# Patient Record
Sex: Female | Born: 1990 | Race: Black or African American | Hispanic: No | Marital: Single | Smoking: Never smoker
Health system: Southern US, Community
[De-identification: ages and names within clinical notes are randomized; demographics above are authoritative.]

---
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
## 2013-01-19 ENCOUNTER — Ambulatory Visit (INDEPENDENT_AMBULATORY_CARE_PROVIDER_SITE_OTHER): Payer: BC Managed Care – PPO | Admitting: Emergency Medicine

## 2013-01-19 VITALS — BP 108/62 | HR 73 | Temp 98.1°F | Resp 18 | Ht 62.0 in | Wt 174.0 lb

## 2013-01-19 DIAGNOSIS — Z111 Encounter for screening for respiratory tuberculosis: Secondary | ICD-10-CM

## 2013-01-19 DIAGNOSIS — Z0000 Encounter for general adult medical examination without abnormal findings: Secondary | ICD-10-CM

## 2013-01-19 NOTE — Progress Notes (Signed)
[facility] [address], [place] [address] [phone]  Date:  01/19/2013   Name:  Daisy Flores   DOB:  10-Jun-1991   MRN:  [id number]  PCP:  No primary provider on file.    Chief Complaint: Annual Exam   History of Present Illness:  Daisy Flores is a 22 y.o. very pleasant female patient who presents with the following:  Wellness examination.  No medications. Denies other complaint or health concern today.    There are no active problems to display for this patient.   History reviewed. No pertinent past medical history.  History reviewed. No pertinent past surgical history.  History  Substance Use Topics  . Smoking status: Never Smoker   . Smokeless tobacco: Not on file  . Alcohol Use: No    Family History  Problem Relation Age of Onset  . Hypertension Mother   . Hypertension Father     No Known Allergies  Medication list has been reviewed and updated.  No current outpatient prescriptions on file prior to visit.   No current facility-administered medications on file prior to visit.    Review of Systems:  As per HPI, otherwise negative.    Physical Examination: Filed Vitals:   01/19/13 1331  BP: 108/62  Pulse: 73  Temp: 98.1 F (36.7 C)  Resp: 18   Filed Vitals:   01/19/13 1331  Height: 5\' 2"  (1.575 m)  Weight: 174 lb (78.926 kg)   Body mass index is 31.82 kg/(m^2). Ideal Body Weight: Weight in (lb) to have BMI = 25: 136.4  GEN: WDWN, NAD, Non-toxic, A & O x 3 HEENT: Atraumatic, Normocephalic. Neck supple. No masses, No LAD. Ears and Nose: No external deformity. CV: RRR, No M/G/R. No JVD. No thrill. No extra heart sounds. PULM: CTA B, no wheezes, crackles, rhonchi. No retractions. No resp. distress. No accessory muscle use. ABD: S, NT, ND, +BS. No rebound. No HSM. EXTR: No c/c/e NEURO Normal gait.  PSYCH: Normally interactive. Conversant. Not depressed or anxious appearing.  Calm demeanor.    Assessment and  Plan: Wellness examination TB screening completed. Recent labs   Signed,  Phillips Odor, MD

## 2013-01-19 NOTE — Addendum Note (Signed)
Addended by: Sheppard Plumber A on: 01/19/2013 02:29 PM   Modules accepted: Orders

## 2013-01-19 NOTE — Progress Notes (Signed)
  Tuberculosis Risk Questionnaire  1. Were you born outside the [place] in one of the following parts of the world: [place], [place], [place], [place] America or Eastern [place]?  No  2. Have you traveled outside the [place] and lived for more than one month in one of the following parts of the world: [place], [place], [place], [place] or [place]?  No  3. Do you have a compromised immune system such as from any of the following conditions:HIV/AIDS, organ or bone marrow transplantation, diabetes, immunosuppressive medicines (e.g. Prednisone, Remicaide), leukemia, lymphoma, cancer of the head or neck, gastrectomy or jejunal bypass, end-stage renal disease (on dialysis), or silicosis?  No   4. Have you ever or do you plan on working in: a residential care center, a health care facility, a jail or prison or homeless shelter?  No  5. Have you ever: injected illegal drugs, used crack cocaine, lived in a homeless shelter  or been in jail or prison?   No  6. Have you ever been exposed to anyone with infectious tuberculosis?  No   Tuberculosis Symptom Questionnaire  Do you currently have any of the following symptoms?  1. Unexplained cough lasting more than 3 weeks? No  2. Unexplained fever lasting more than 3 weeks. No   3. Night Sweats (sweating that leaves the bedclothes and sheets wet)   No  4. Shortness of Breath No  5. Chest Pain No  6. Unintentional weight loss  No  7. Unexplained fatigue (very tired for no reason) No  

## 2013-01-21 ENCOUNTER — Ambulatory Visit (INDEPENDENT_AMBULATORY_CARE_PROVIDER_SITE_OTHER): Payer: BC Managed Care – PPO | Admitting: Radiology

## 2013-01-21 DIAGNOSIS — Z111 Encounter for screening for respiratory tuberculosis: Secondary | ICD-10-CM

## 2013-01-21 LAB — TB SKIN TEST
Induration: 0 mm
TB Skin Test: NEGATIVE

## 2014-08-11 ENCOUNTER — Encounter (HOSPITAL_BASED_OUTPATIENT_CLINIC_OR_DEPARTMENT_OTHER): Payer: Self-pay | Admitting: *Deleted

## 2014-08-11 ENCOUNTER — Emergency Department (HOSPITAL_BASED_OUTPATIENT_CLINIC_OR_DEPARTMENT_OTHER): Payer: BC Managed Care – PPO

## 2014-08-11 ENCOUNTER — Emergency Department (HOSPITAL_BASED_OUTPATIENT_CLINIC_OR_DEPARTMENT_OTHER)
Admission: EM | Admit: 2014-08-11 | Discharge: 2014-08-11 | Disposition: A | Discharge status: Home / Self Care | Payer: BC Managed Care – PPO | Attending: Emergency Medicine | Admitting: Emergency Medicine

## 2014-08-11 ENCOUNTER — Telehealth: Payer: Self-pay | Admitting: Obstetrics

## 2014-08-11 DIAGNOSIS — R9431 Abnormal electrocardiogram [ECG] [EKG]: Secondary | ICD-10-CM | POA: Diagnosis not present

## 2014-08-11 DIAGNOSIS — Z3202 Encounter for pregnancy test, result negative: Secondary | ICD-10-CM | POA: Insufficient documentation

## 2014-08-11 DIAGNOSIS — F419 Anxiety disorder, unspecified: Secondary | ICD-10-CM | POA: Diagnosis not present

## 2014-08-11 DIAGNOSIS — R002 Palpitations: Secondary | ICD-10-CM | POA: Diagnosis present

## 2014-08-11 DIAGNOSIS — R0602 Shortness of breath: Secondary | ICD-10-CM | POA: Insufficient documentation

## 2014-08-11 DIAGNOSIS — R000 Tachycardia, unspecified: Secondary | ICD-10-CM | POA: Diagnosis not present

## 2014-08-11 LAB — URINALYSIS, ROUTINE W REFLEX MICROSCOPIC
Bilirubin Urine: NEGATIVE
GLUCOSE, UA: NEGATIVE mg/dL
HGB URINE DIPSTICK: NEGATIVE
Ketones, ur: NEGATIVE mg/dL
Leukocytes, UA: NEGATIVE
Nitrite: NEGATIVE
PH: 6.5 (ref 5.0–8.0)
Protein, ur: NEGATIVE mg/dL
SPECIFIC GRAVITY, URINE: 1.004 — AB (ref 1.005–1.030)
UROBILINOGEN UA: 0.2 mg/dL (ref 0.0–1.0)

## 2014-08-11 LAB — BASIC METABOLIC PANEL
Anion gap: 6 (ref 5–15)
BUN: 11 mg/dL (ref 6–23)
CALCIUM: 9.1 mg/dL (ref 8.4–10.5)
CHLORIDE: 105 mmol/L (ref 96–112)
CO2: 24 mmol/L (ref 19–32)
Creatinine, Ser: 0.96 mg/dL (ref 0.50–1.10)
GFR calc Af Amer: >90 mL/min (ref >90)
GFR calc non Af Amer: 83 mL/min — ABNORMAL LOW (ref >90)
GLUCOSE: 145 mg/dL — AB (ref 70–99)
Potassium: 3.4 mmol/L — ABNORMAL LOW (ref 3.5–5.1)
SODIUM: 135 mmol/L (ref 135–145)

## 2014-08-11 LAB — D-DIMER, QUANTITATIVE (NOT AT ARMC)

## 2014-08-11 LAB — CBC
HCT: 37.7 % (ref 36.0–46.0)
Hemoglobin: 13.3 g/dL (ref 12.0–15.0)
MCH: 31.3 pg (ref 26.0–34.0)
MCHC: 35.3 g/dL (ref 30.0–36.0)
MCV: 88.7 fL (ref 78.0–100.0)
PLATELETS: 370 10*3/uL (ref 150–400)
RBC: 4.25 MIL/uL (ref 3.87–5.11)
RDW: 12.7 % (ref 11.5–15.5)
WBC: 5.3 10*3/uL (ref 4.0–10.5)

## 2014-08-11 LAB — PREGNANCY, URINE: Preg Test, Ur: NEGATIVE

## 2014-08-11 LAB — TROPONIN I: Troponin I: <0.03 ng/mL (ref <0.031)

## 2014-08-11 MED ORDER — LORAZEPAM 1 MG PO TABS: 1.0000 mg | ORAL_TABLET | Freq: Three times a day (TID) | ORAL | Status: AC | PRN

## 2014-08-11 MED ORDER — LORAZEPAM 2 MG/ML IJ SOLN
1.0000 mg | Freq: Once | INTRAMUSCULAR | Status: AC
  Administered 2014-08-11: 1 mg via INTRAVENOUS
  Filled 2014-08-11: qty 1

## 2014-08-11 NOTE — ED Provider Notes (Signed)
CSN: [id number]     Arrival date & time 08/11/14  1520 History   First MD Initiated Contact with Patient 08/11/14 1529     Chief Complaint  Patient presents with  . Palpitations     (Consider location/radiation/quality/duration/timing/severity/associated sxs/prior Treatment) HPI Comments: 24 year old female complaining of palpitations and shortness of breath 3 days. Palpitations have increased in intensity, shortness of breath worse when she is talking for long period of time. 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 She was seen by her PCP at the urgent care center 2 days ago and was started on anti-anxiety medication which is not helping her symptoms and believes is making them worse. Admits to being very nervous at this time as she is not sure why she has these symptoms. Denies chest pain, lightheadedness, dizziness. No family history of early heart problems. No history of blood clots. No exogenous estrogen, no recent surgeries, nonsmoker, no recent long travel. Denies calf pain or swelling.  Patient is a 24 y.o. female presenting with palpitations. The history is provided by the patient, a relative and a parent.  Palpitations Palpitations quality:  Fast Onset quality:  Gradual Timing:  Constant Progression:  Worsening Chronicity:  New Relieved by:  Nothing Worsened by:  Stress (talking a lot) Ineffective treatments: anxiolytic. Associated symptoms: shortness of breath   Risk factors: no hx of DVT and no hx of PE     History reviewed. No pertinent past medical history. History reviewed. No pertinent past surgical history. Family History  Problem Relation Age of Onset  . Hypertension Mother   . Hypertension Father    History  Substance Use Topics  . Smoking status: Never Smoker   . Smokeless tobacco: Not on file  . Alcohol Use: No   OB History    No data available     Review of Systems  Respiratory: Positive for shortness of breath.    Cardiovascular: Positive for palpitations.  Psychiatric/Behavioral: The patient is nervous/anxious.   All other systems reviewed and are negative.     Allergies  Review of patient's allergies indicates no known allergies.  Home Medications   Prior to Admission medications   Medication Sig Start Date End Date Taking? Authorizing Provider  LORazepam (ATIVAN) 1 MG tablet Take 1 tablet (1 mg total) by mouth every 8 (eight) hours as needed for anxiety. 08/11/14   Tyneka Scafidi M Hermon Zea, PA-C   BP 129/81 mmHg  Pulse 116  Temp(Src) 98.3 F (36.8 C)  Resp 16  Ht 5\' 3"  (1.6 m)  Wt 164 lb (74.39 kg)  BMI 29.06 kg/m2  SpO2 100%  LMP 08/05/2014 Physical Exam  Constitutional: She is oriented to person, place, and time. She appears well-developed and well-nourished. No distress.  HENT:  Head: Normocephalic and atraumatic.  Mouth/Throat: Oropharynx is clear and moist.  Eyes: Conjunctivae and EOM are normal. Pupils are equal, round, and reactive to light.  Neck: Normal range of motion. Neck supple. No JVD present.  Cardiovascular: Regular rhythm, normal heart sounds and intact distal pulses.   Tachycardic. No extremity edema.  Pulmonary/Chest: Effort normal and breath sounds normal. No respiratory distress.  Abdominal: Soft. Bowel sounds are normal. There is no tenderness.  Musculoskeletal: Normal range of motion. She exhibits no edema.  Neurological: She is alert and oriented to person, place, and time. She has normal strength. No sensory deficit.  Speech fluent, goal oriented. Moves limbs without ataxia. Equal grip strength bilateral.  Skin: Skin is warm  and dry. She is not diaphoretic.  Psychiatric: Her behavior is normal. Her mood appears anxious.  Nursing note and vitals reviewed.   ED Course  Procedures (including critical care time) Labs Review Labs Reviewed  BASIC METABOLIC PANEL - Abnormal; Notable for the following:    Potassium 3.4 (*)    Glucose, Bld 145 (*)    GFR calc non  Af Amer 83 (*)    All other components within normal limits  URINALYSIS, ROUTINE W REFLEX MICROSCOPIC - Abnormal; Notable for the following:    Specific Gravity, Urine 1.004 (*)    All other components within normal limits  CBC  D-DIMER, QUANTITATIVE  TROPONIN I  PREGNANCY, URINE    Imaging Review Dg Chest 2 View  08/11/2014   CLINICAL DATA:  Palpitations  EXAM: CHEST  2 VIEW  COMPARISON:  None.  FINDINGS: Cardiomediastinal silhouette is unremarkable. No acute infiltrate or pleural effusion. No pulmonary edema. Bony thorax is unremarkable.  IMPRESSION: No active cardiopulmonary disease.   Electronically Signed   By: Natasha Mead M.D.   On: 08/11/2014 16:35     EKG Interpretation   Date/Time:  Wednesday August 11 2014 15:30:59 EST Ventricular Rate:  112 PR Interval:  132 QRS Duration: 80 QT Interval:  366 QTC Calculation: 499 R Axis:   95 Text Interpretation:  Sinus tachycardia Possible Left atrial enlargement  Rightward axis T wave abnormality, consider inferior ischemia T wave  abnormality, consider anterolateral ischemia Abnormal ECG No old tracing  to compare Confirmed by GOLDSTON  MD, SCOTT ([id number]) on 08/11/2014 3:30:58 PM      MDM   Final diagnoses:  Palpitations  Abnormal EKG  Anxiety   Anxious but in NAD. Afebrile, tachycardic, vitals otherwise stable. Initial concern for PE given tachycardia with associated palpitations and shortness of breath. D-dimer within normal limits. No risk factors for PE. Nonspecific findings on EKG, no old to compare. Dr. Criss Alvine discussed this EKG while he was on the phone with the cardiologist Dr. Anne Fu, who states the findings are nonspecific, however will have follow-up outpatient in the office. He made an appointment for her for March 15 at 2 PM. Workup otherwise negative. Reports improvement with Ativan. Advised her to no longer take clorazepate and to start taking Ativan prn. Stable for discharge. Return precautions given. Patient states  understanding of treatment care plan and is agreeable.  Discussed with attending Dr. Criss Alvine who agrees with plan of care.    Kathrynn Speed, PA-C 08/11/14 1726  Pricilla Loveless, MD 08/16/14 [phone]

## 2014-08-11 NOTE — ED Notes (Signed)
Pt reports she has felt like her heart has been racing and she has had some shortness of breath. She states that she has been to urgent care and given meds for anxiety without relief

## 2014-08-11 NOTE — Discharge Instructions (Signed)
Take Ativanas needed as directed for anxiety. No driving or operating heavy machinery while taking ativan. This medication may cause drowsiness.  Palpitations A palpitation is the feeling that your heartbeat is irregular or is faster than normal. It may feel like your heart is fluttering or skipping a beat. Palpitations are usually not a serious problem. However, in some cases, you may need further medical evaluation. CAUSES  Palpitations can be caused by:  Smoking.  Caffeine or other stimulants, such as diet pills or energy drinks.  Alcohol.  Stress and anxiety.  Strenuous physical activity.  Fatigue.  Certain medicines.  Heart disease, especially if you have a history of irregular heart rhythms (arrhythmias), such as atrial fibrillation, atrial flutter, or supraventricular tachycardia.  An improperly working pacemaker or defibrillator. DIAGNOSIS  To find the cause of your palpitations, your health care provider will take your medical history and perform a physical exam. Your health care provider may also have you take a test called an ambulatory electrocardiogram (ECG). An ECG records your heartbeat patterns over a 24-hour period. You may also have other tests, such as:  Transthoracic echocardiogram (TTE). During echocardiography, sound waves are used to evaluate how blood flows through your heart.  Transesophageal echocardiogram (TEE).  Cardiac monitoring. This allows your health care provider to monitor your heart rate and rhythm in real time.  Holter monitor. This is a portable device that records your heartbeat and can help diagnose heart arrhythmias. It allows your health care provider to track your heart activity for several days, if needed.  Stress tests by exercise or by giving medicine that makes the heart beat faster. TREATMENT  Treatment of palpitations depends on the cause of your symptoms and can vary greatly. Most cases of palpitations do not require any treatment  other than time, relaxation, and monitoring your symptoms. Other causes, such as atrial fibrillation, atrial flutter, or supraventricular tachycardia, usually require further treatment. HOME CARE INSTRUCTIONS   Avoid:  Caffeinated coffee, tea, soft drinks, diet pills, and energy drinks.  Chocolate.  Alcohol.  Stop smoking if you smoke.  Reduce your stress and anxiety. Things that can help you relax include:  A method of controlling things in your body, such as your heartbeats, with your mind (biofeedback).  Yoga.  Meditation.  Physical activity such as swimming, jogging, or walking.  Get plenty of rest and sleep. SEEK MEDICAL CARE IF:   You continue to have a fast or irregular heartbeat beyond 24 hours.  Your palpitations occur more often. SEEK IMMEDIATE MEDICAL CARE IF:  You have chest pain or shortness of breath.  You have a severe headache.  You feel dizzy or you faint. MAKE SURE YOU:  Understand these instructions.  Will watch your condition.  Will get help right away if you are not doing well or get worse. Document Released: 05/25/2000 Document Revised: 06/02/2013 Document Reviewed: 07/27/2011 [facility] Patient Information 2015 [place]. This information is not intended to replace advice given to you by your health care provider. Make sure you discuss any questions you have with your health care provider.  Panic Attacks Panic attacks are sudden, short-livedsurges of severe anxiety, fear, or discomfort. They may occur for no reason when you are relaxed, when you are anxious, or when you are sleeping. Panic attacks may occur for a number of reasons:   Healthy people occasionally have panic attacks in extreme, life-threatening situations, such as war or natural disasters. Normal anxiety is a protective mechanism of the body that helps us  react to danger (fight or flight response).  Panic attacks are often seen with anxiety disorders, such as panic disorder,  social anxiety disorder, generalized anxiety disorder, and phobias. Anxiety disorders cause excessive or uncontrollable anxiety. They may interfere with your relationships or other life activities.  Panic attacks are sometimes seen with other mental illnesses, such as depression and posttraumatic stress disorder.  Certain medical conditions, prescription medicines, and drugs of abuse can cause panic attacks. SYMPTOMS  Panic attacks start suddenly, peak within 20 minutes, and are accompanied by four or more of the following symptoms:  Pounding heart or fast heart rate (palpitations).  Sweating.  Trembling or shaking.  Shortness of breath or feeling smothered.  Feeling choked.  Chest pain or discomfort.  Nausea or strange feeling in your stomach.  Dizziness, light-headedness, or feeling like you will faint.  Chills or hot flushes.  Numbness or tingling in your lips or hands and feet.  Feeling that things are not real or feeling that you are not yourself.  Fear of losing control or going crazy.  Fear of dying. Some of these symptoms can mimic serious medical conditions. For example, you may think you are having a heart attack. Although panic attacks can be very scary, they are not life threatening. DIAGNOSIS  Panic attacks are diagnosed through an assessment by your health care provider. Your health care provider will ask questions about your symptoms, such as where and when they occurred. Your health care provider will also ask about your medical history and use of alcohol and drugs, including prescription medicines. Your health care provider may order blood tests or other studies to rule out a serious medical condition. Your health care provider may refer you to a mental health professional for further evaluation. TREATMENT   Most healthy people who have one or two panic attacks in an extreme, life-threatening situation will not require treatment.  The treatment for panic  attacks associated with anxiety disorders or other mental illness typically involves counseling with a mental health professional, medicine, or a combination of both. Your health care provider will help determine what treatment is best for you.  Panic attacks due to physical illness usually go away with treatment of the illness. If prescription medicine is causing panic attacks, talk with your health care provider about stopping the medicine, decreasing the dose, or substituting another medicine.  Panic attacks due to alcohol or drug abuse go away with abstinence. Some adults need professional help in order to stop drinking or using drugs. HOME CARE INSTRUCTIONS   Take all medicines as directed by your health care provider.   Schedule and attend follow-up visits as directed by your health care provider. It is important to keep all your appointments. SEEK MEDICAL CARE IF:  You are not able to take your medicines as prescribed.  Your symptoms do not improve or get worse. SEEK IMMEDIATE MEDICAL CARE IF:   You experience panic attack symptoms that are different than your usual symptoms.  You have serious thoughts about hurting yourself or others.  You are taking medicine for panic attacks and have a serious side effect. MAKE SURE YOU:  Understand these instructions.  Will watch your condition.  Will get help right away if you are not doing well or get worse. Document Released: 05/28/2005 Document Revised: 06/02/2013 Document Reviewed: 01/09/2013 [facility] Patient Information 2015 [place], [place]. This information is not intended to replace advice given to you by your health care provider. Make sure you discuss any questions you  have with your health care provider. ° °

## 2014-08-13 NOTE — Telephone Encounter (Signed)
08/13/2014 - Patient called and scheduled AEX for 08/26/2014 @ 3:45p. brm

## 2014-08-24 ENCOUNTER — Ambulatory Visit (INDEPENDENT_AMBULATORY_CARE_PROVIDER_SITE_OTHER): Payer: BC Managed Care – PPO | Admitting: Cardiology

## 2014-08-24 ENCOUNTER — Encounter: Payer: Self-pay | Admitting: Cardiology

## 2014-08-24 VITALS — BP 132/79 | HR 70 | Ht 63.0 in | Wt 160.0 lb

## 2014-08-24 DIAGNOSIS — R002 Palpitations: Secondary | ICD-10-CM

## 2014-08-24 DIAGNOSIS — R9431 Abnormal electrocardiogram [ECG] [EKG]: Secondary | ICD-10-CM

## 2014-08-24 DIAGNOSIS — R0789 Other chest pain: Secondary | ICD-10-CM

## 2014-08-24 DIAGNOSIS — R000 Tachycardia, unspecified: Secondary | ICD-10-CM

## 2014-08-24 NOTE — Patient Instructions (Signed)
The current medical regimen is effective;  continue present plan and medications.  Your physician has requested that you have an echocardiogram. Echocardiography is a painless test that uses sound waves to create images of your heart. It provides your doctor with information about the size and shape of your heart and how well your heart's chambers and valves are working. This procedure takes approximately one hour. There are no restrictions for this procedure.  Please have a TSH drawn when you see your OB/GYN.  Follow up as needed.  Thank you for choosing [facility]!!

## 2014-08-24 NOTE — Progress Notes (Signed)
Cardiology Office Note   Date:  08/24/2014   ID:  Daisy Flores, DOB [birth year], MRN [id number]  PCP:  No PCP Per Patient  Cardiologist:   Donato Schultz, MD   Chief Complaint  Patient presents with  . Advice Only      History of Present Illness: Daisy Flores is a 24 y.o. female who presents for evaluation of palpitations. She was seen in the emergency department on 08/11/14 after complaining of shortness of breath for 3 days. She was quite anxious at urgent care. Felt shortness of breath. D-dimer was performed in the emergency room and was normal. EKG on 08/11/14 showed sinus tachycardia rate 112 with T-wave inversions, fairly nonspecific diffusely. Chest x-ray was normal, personally viewed. Blood work showed slightly decreased potassium at 3.4. Otherwise unremarkable. Glucose was also slightly elevated at 145.  She was feeling some anterior wall chest discomfort. Her father stated that she was carrying 3 bags after recent trip, straining. No exertional chest discomfort. She wonders if after she started to feel this discomfort is anxiety took over and she began to feel short of breath. She is no longer having any of her symptoms.  She is a [employer] officer.  No past medical history on file.  No past surgical history on file.   Current Outpatient Prescriptions  Medication Sig Dispense Refill  . amoxicillin (AMOXIL) 500 MG capsule   0  . clarithromycin (BIAXIN) 500 MG tablet Take 500 mg by mouth 2 (two) times daily.  0  . cyclobenzaprine (FLEXERIL) 5 MG tablet Take 5 mg by mouth 3 (three) times daily as needed.  0  . naproxen (NAPROSYN) 500 MG tablet Take 500 mg by mouth every 12 (twelve) hours.  0  . LORazepam (ATIVAN) 1 MG tablet Take 1 tablet (1 mg total) by mouth every 8 (eight) hours as needed for anxiety. (Patient not taking: Reported on 08/24/2014) 10 tablet 0   No current facility-administered medications for this visit.    Allergies:   Penicillins    Social History:   The patient  reports that she has never smoked. She does not have any smokeless tobacco history on file. She reports that she does not drink alcohol or use illicit drugs.   Family History:  The patient's family history includes Hypertension in her father and mother.    ROS:  Please see the history of present illness.   Otherwise, review of systems are positive for none.   All other systems are reviewed and negative.    PHYSICAL EXAM: VS:  BP 132/79 mmHg  Pulse 70  Ht  (1.6 m)  Wt 160 lb (72.576 kg)  BMI 28.35 kg/m2  LMP 08/05/2014 , BMI Body mass index is 28.35 kg/(m^2). GEN: Well nourished, well developed, in no acute distress HEENT: normal Neck: no JVD, carotid bruits, or masses, mild fullness in thyroid region Cardiac: RRR (at first, mildly tachycardic but quickly resumed normal rate; no murmurs, rubs, or gallops,no edema  Respiratory:  clear to auscultation bilaterally, normal work of breathing GI: soft, nontender, nondistended, + BS MS: no deformity or atrophy Skin: warm and dry, no rash Neuro:  Strength and sensation are intact Psych: euthymic mood, full affect   EKG:  08/11/14-sinus tachycardia 116 with T-wave inversions diffusely, fairly nonspecific changes. Perhaps repolarization abnormality because of tachycardia.   Recent Labs: 08/11/2014: BUN 11; Creatinine 0.96; Hemoglobin 13.3; Platelets 370; Potassium 3.4*; Sodium 135    Lipid Panel No results found for: CHOL, TRIG, HDL,  CHOLHDL, VLDL, LDLCALC, LDLDIRECT    Wt Readings from Last 3 Encounters:  08/24/14 160 lb (72.576 kg)  08/11/14 164 lb (74.39 kg)  01/19/13 174 lb (78.926 kg)      Other studies Reviewed: Additional studies/ records that were reviewed today include: Review of emergency room labs, chest x-ray personally reviewed and normal.    ASSESSMENT AND PLAN:  1.  Abnormal EKG-fairly nonspecific T-wave changes during tachycardia/anxiety in the emergency room. I would like to check an  echocardiogram to ensure proper structure and function and to exclude hypertrophic phenotype.  2. Atypical chest pain-likely secondary to anxiety. Perhaps musculoskeletal because she was carrying several bags of luggage after a recent trip. No further recurrence. She is feeling well. Reassurance.  3. Tachycardia-she is going to see the OB/GYN soon. I asked her to have her TSH checked. There is mild prominence in the thyroid region. I want to exclude the possibility of hyperthyroidism causing tachycardia/anxiety. This seems unlikely since she is now feeling normal.  4. Elevated glucose-145 nonfasting. Asked her to follow-up with this with her primary OB/GYN.  5. Mildly decreased potassium-3.4. She does not like bananas or orange juice. Try other foods higher in potassium.  We will follow-up with echocardiogram and see her on as-needed basis.   Current medicines are reviewed at length with the patient today.  The patient does not have concerns regarding medicines.  The following changes have been made:  no change  Labs/ tests ordered today include:   Orders Placed This Encounter  Procedures  . 2D Echocardiogram without contrast     Disposition:   FU with echocardiogram. Otherwise when necessary basis.   Mathews RobinsonsSigned, Trianna Lupien, MD  08/24/2014 2:44 PM    [facility] [address], [place], [place]  [address] Phone: [phone]; Fax: [phone]

## 2014-08-26 ENCOUNTER — Encounter: Payer: Self-pay | Admitting: Certified Nurse Midwife

## 2014-08-26 ENCOUNTER — Ambulatory Visit (INDEPENDENT_AMBULATORY_CARE_PROVIDER_SITE_OTHER): Payer: BC Managed Care – PPO | Admitting: Certified Nurse Midwife

## 2014-08-26 VITALS — BP 130/86 | HR 96 | Temp 99.1°F | Ht 63.0 in | Wt 155.0 lb

## 2014-08-26 DIAGNOSIS — Z124 Encounter for screening for malignant neoplasm of cervix: Secondary | ICD-10-CM

## 2014-08-26 DIAGNOSIS — N63 Unspecified lump in unspecified breast: Secondary | ICD-10-CM

## 2014-08-26 DIAGNOSIS — R739 Hyperglycemia, unspecified: Secondary | ICD-10-CM

## 2014-08-26 DIAGNOSIS — N946 Dysmenorrhea, unspecified: Secondary | ICD-10-CM

## 2014-08-26 DIAGNOSIS — A749 Chlamydial infection, unspecified: Secondary | ICD-10-CM

## 2014-08-26 DIAGNOSIS — F410 Panic disorder [episodic paroxysmal anxiety] without agoraphobia: Secondary | ICD-10-CM | POA: Diagnosis not present

## 2014-08-26 DIAGNOSIS — R7309 Other abnormal glucose: Secondary | ICD-10-CM

## 2014-08-26 DIAGNOSIS — Z01419 Encounter for gynecological examination (general) (routine) without abnormal findings: Secondary | ICD-10-CM | POA: Diagnosis not present

## 2014-08-26 DIAGNOSIS — E569 Vitamin deficiency, unspecified: Secondary | ICD-10-CM | POA: Diagnosis not present

## 2014-08-26 DIAGNOSIS — R000 Tachycardia, unspecified: Secondary | ICD-10-CM

## 2014-08-26 DIAGNOSIS — Z113 Encounter for screening for infections with a predominantly sexual mode of transmission: Secondary | ICD-10-CM

## 2014-08-26 LAB — THYROID PANEL WITH TSH
Free Thyroxine Index: 2.4 (ref 1.4–3.8)
T3 UPTAKE: 28 % (ref 22–35)
T4, Total: 8.6 ug/dL (ref 4.5–12.0)
TSH: 1.482 u[IU]/mL (ref 0.350–4.500)

## 2014-08-26 LAB — COMPREHENSIVE METABOLIC PANEL
ALBUMIN: 4.4 g/dL (ref 3.5–5.2)
ALT: <8 U/L (ref 0–35)
AST: 16 U/L (ref 0–37)
Alkaline Phosphatase: 52 U/L (ref 39–117)
BUN: 17 mg/dL (ref 6–23)
CALCIUM: 9.8 mg/dL (ref 8.4–10.5)
CHLORIDE: 103 meq/L (ref 96–112)
CO2: 21 mEq/L (ref 19–32)
Creat: 1.07 mg/dL (ref 0.50–1.10)
Glucose, Bld: 88 mg/dL (ref 70–99)
POTASSIUM: 4 meq/L (ref 3.5–5.3)
SODIUM: 138 meq/L (ref 135–145)
Total Bilirubin: 0.5 mg/dL (ref 0.2–1.2)
Total Protein: 7.2 g/dL (ref 6.0–8.3)

## 2014-08-26 LAB — HEPATITIS B SURFACE ANTIGEN: Hepatitis B Surface Ag: NEGATIVE

## 2014-08-26 LAB — HEPATITIS C ANTIBODY: HCV Ab: NEGATIVE

## 2014-08-26 MED ORDER — NORGESTIM-ETH ESTRAD TRIPHASIC 0.18/0.215/0.25 MG-25 MCG PO TABS: 1.0000 | ORAL_TABLET | Freq: Every day | ORAL | Status: DC

## 2014-08-26 MED ORDER — AZITHROMYCIN 200 MG/5ML PO SUSR: ORAL | Status: AC

## 2014-08-26 MED ORDER — IBUPROFEN 50 MG PO CHEW: CHEWABLE_TABLET | ORAL | Status: DC

## 2014-08-26 NOTE — Progress Notes (Signed)
Patient ID: Daisy Flores, female   DOB: December 13, 1990, 24 y.o.   MRN: [id number]   Subjective:     Daisy Flores is a 24 y.o. female here for a routine exam.  Current complaints: recent STI exposure and recent dx of tachycardia desiring f/u, along with desire to get a contraceptive that will help with her acne.  C/O menstrual cramping with increased flow the first few days.  Denies any clots with blood flow.  Denies any hx for family hx of cancers, MI, HTN.  Has an echocardiogram scheduled for May 2016.  Denies any new sexual partner.  Encouraged safe sex practices.  Educated on SBE exams.  Cannot tolerate taking large medications, requested liquid/chewables.      Personal health questionnaire:  Is patient Ashkenazi Jewish, have a family history of breast and/or ovarian cancer: no Is there a family history of uterine cancer diagnosed at age < 51, gastrointestinal cancer, urinary tract cancer, family member who is a Personnel officer syndrome-associated carrier: no Is the patient overweight and hypertensive, family history of diabetes, personal history of gestational diabetes, preeclampsia or PCOS: no Is patient over 28, have PCOS,  family history of premature CHD under age 32, diabetes, smoke, have hypertension or peripheral artery disease:  no At any time, has a partner hit, kicked or otherwise hurt or frightened you?: no Over the past 2 weeks, have you felt down, depressed or hopeless?: no Over the past 2 weeks, have you felt little interest or pleasure in doing things?:no   Gynecologic History Patient's last menstrual period was 08/05/2014. Contraception: none Last Pap: none.  Last mammogram: none.   Obstetric History OB History  No data available    History reviewed. No pertinent past medical history.  History reviewed. No pertinent past surgical history.   Current outpatient prescriptions:  .  cyclobenzaprine (FLEXERIL) 5 MG tablet, Take 5 mg by mouth 3 (three) times daily as needed., Disp: ,  Rfl: 0 .  LORazepam (ATIVAN) 1 MG tablet, Take 1 tablet (1 mg total) by mouth every 8 (eight) hours as needed for anxiety., Disp: 10 tablet, Rfl: 0 .  naproxen (NAPROSYN) 500 MG tablet, Take 500 mg by mouth every 12 (twelve) hours., Disp: , Rfl: 0 .  azithromycin (ZITHROMAX) 200 MG/5ML suspension, Take 25ml PO all at one time., Disp: 30 mL, Rfl: 0 .  clarithromycin (BIAXIN) 500 MG tablet, Take 500 mg by mouth 2 (two) times daily., Disp: , Rfl: 0 .  ibuprofen (ADVIL,MOTRIN) 50 MG chewable tablet, Up to 800 mg three times a day for cramps., Disp: 60 tablet, Rfl: 2 .  Norgestimate-Ethinyl Estradiol Triphasic (ORTHO TRI-CYCLEN LO) 0.18/0.215/0.25 MG-25 MCG tab, Take 1 tablet by mouth daily., Disp: 1 Package, Rfl: 11 Allergies  Allergen Reactions  . Penicillins Hives and Rash    PT STATES ALL TYPES OF CILLINS CAUSE RASHES AS WELL AS HIVES    History  Substance Use Topics  . Smoking status: Never Smoker   . Smokeless tobacco: Not on file  . Alcohol Use: Yes     Comment: Occ.     Family History  Problem Relation Age of Onset  . Hypertension Mother   . Hypertension Father       Review of Systems  Constitutional: negative for fatigue and weight loss Respiratory: negative for cough and wheezing Cardiovascular: negative for chest pain, fatigue and palpitations Gastrointestinal: negative for abdominal pain and change in bowel habits Musculoskeletal:negative for myalgias Neurological: negative for gait problems and tremors Behavioral/Psych: negative for  abusive relationship, depression Endocrine: negative for temperature intolerance   Genitourinary:negative for abnormal menstrual periods, genital lesions, hot flashes, sexual problems and vaginal discharge Integument/breast: negative for breast lump, breast tenderness, nipple discharge and skin lesion(s)    Objective:       BP 130/86 mmHg  Pulse 96  Temp(Src) 99.1 F (37.3 C)  Ht 5\' 3"  (1.6 m)  Wt 70.308 kg (155 lb)  BMI 27.46  kg/m2  LMP 08/05/2014 General:   alert  Skin:   no rash or abnormalities  Lungs:   clear to auscultation bilaterally  Heart:   regular rate and rhythm, S1, S2 normal, no murmur, click, rub or gallop  Breasts:   normal skin no nipple changes or axillary nodes.  Bilateral suspicious masses, small noted on exam.  No nipple discharge.   Abdomen:  normal findings: no organomegaly, soft, non-tender and no hernia  Pelvis:  External genitalia: normal general appearance Urinary system: urethral meatus normal and bladder without fullness, nontender Vaginal: normal without tenderness, induration or masses Cervix: cervicitis associated with Chlamydia infection. Adnexa: normal bimanual exam Uterus: anteverted and non-tender, normal size   Lab Review Urine pregnancy test Labs reviewed yes Radiologic studies reviewed no  Assessment:     Chlamydia.   Breast masses bilaterally, most likely fibrocystic breast disease Dysmenorrhea Contraceptive Counseling High risk sexual activity STD exam Tachycardia   Plan:    Education reviewed: calcium supplements, depression evaluation, low fat, low cholesterol diet, safe sex/STD prevention, self breast exams and skin cancer screening. Contraception: OCP (estrogen/progesterone). Mammogram ordered. Follow up in: 3 months.   Meds ordered this encounter  Medications  . azithromycin (ZITHROMAX) 200 MG/5ML suspension    Sig: Take 25ml PO all at one time.    Dispense:  30 mL    Refill:  0  . ibuprofen (ADVIL,MOTRIN) 50 MG chewable tablet    Sig: Up to 800 mg three times a day for cramps.    Dispense:  60 tablet    Refill:  2  . Norgestimate-Ethinyl Estradiol Triphasic (ORTHO TRI-CYCLEN LO) 0.18/0.215/0.25 MG-25 MCG tab    Sig: Take 1 tablet by mouth daily.    Dispense:  1 Package    Refill:  11   Orders Placed This Encounter  Procedures  . SureSwab, Vaginosis/Vaginitis Plus  . MM Digital Diagnostic Bilat    Standing Status: Future     Number of  Occurrences:      Standing Expiration Date: 10/26/2015    Order Specific Question:  Reason for Exam (SYMPTOM  OR DIAGNOSIS REQUIRED)    Answer:  multiple small nodules in each breast    Order Specific Question:  Is the patient pregnant?    Answer:  No    Order Specific Question:  Preferred imaging location?    Answer[facility]  . US Unlisted Procedure Breast    Standing Status: Future     Number of Occurrences:      Standing Expiration Date: 10/26/2015    Order Specific Question:  Reason for Exam (SYMPTOM  OR DIAGNOSIS REQUIRED)    Answer:  multiple small masses in each breast    Order Specific Question:  Preferred imaging location?    [facility]  . Thyroid Panel With TSH  . Comprehensive metabolic panel  . Hemoglobin A1c  . HIV antibody (with reflex)  . Hepatitis B surface antigen  . RPR  . Hepatitis C antibody  . POCT urine pregnancy   Need to obtain previous records

## 2014-08-27 ENCOUNTER — Other Ambulatory Visit: Payer: Self-pay | Admitting: Certified Nurse Midwife

## 2014-08-27 ENCOUNTER — Telehealth: Payer: Self-pay

## 2014-08-27 DIAGNOSIS — N63 Unspecified lump in unspecified breast: Secondary | ICD-10-CM

## 2014-08-27 LAB — HEMOGLOBIN A1C
Hgb A1c MFr Bld: 5.2 % (ref <5.7)
MEAN PLASMA GLUCOSE: 103 mg/dL (ref <117)

## 2014-08-27 LAB — RPR

## 2014-08-27 LAB — HIV ANTIBODY (ROUTINE TESTING W REFLEX): HIV: NONREACTIVE

## 2014-08-27 NOTE — Telephone Encounter (Signed)
Patient has appt with GI the [facility] center at 09/02/14 at 11am

## 2014-08-29 LAB — SURESWAB, VAGINOSIS/VAGINITIS PLUS
ATOPOBIUM VAGINAE: 6.7 Log (cells/mL)
C. ALBICANS, DNA: NOT DETECTED
C. GLABRATA, DNA: NOT DETECTED
C. PARAPSILOSIS, DNA: NOT DETECTED
C. TROPICALIS, DNA: NOT DETECTED
C. trachomatis RNA, TMA: DETECTED — AB
Gardnerella vaginalis: >8 Log (cells/mL)
LACTOBACILLUS SPECIES: NOT DETECTED Log (cells/mL)
MEGASPHAERA SPECIES: 7.5 Log (cells/mL)
N. GONORRHOEAE RNA, TMA: NOT DETECTED
T. vaginalis RNA, QL TMA: NOT DETECTED

## 2014-08-30 LAB — PAP IG W/ RFLX HPV ASCU

## 2014-08-31 ENCOUNTER — Other Ambulatory Visit: Payer: BC Managed Care – PPO

## 2014-09-01 ENCOUNTER — Ambulatory Visit
Admission: RE | Admit: 2014-09-01 | Discharge: 2014-09-01 | Disposition: A | Discharge status: Home / Self Care | Payer: BC Managed Care – PPO | Source: Ambulatory Visit | Attending: Certified Nurse Midwife | Admitting: Certified Nurse Midwife

## 2014-09-01 ENCOUNTER — Other Ambulatory Visit: Payer: Self-pay | Admitting: *Deleted

## 2014-09-01 DIAGNOSIS — N63 Unspecified lump in unspecified breast: Secondary | ICD-10-CM

## 2014-09-01 DIAGNOSIS — B9689 Other specified bacterial agents as the cause of diseases classified elsewhere: Secondary | ICD-10-CM

## 2014-09-01 DIAGNOSIS — N760 Acute vaginitis: Principal | ICD-10-CM

## 2014-09-01 MED ORDER — METRONIDAZOLE 0.75 % VA GEL: 1.0000 | Freq: Every day | VAGINAL | Status: DC

## 2014-09-02 ENCOUNTER — Other Ambulatory Visit: Payer: BC Managed Care – PPO

## 2014-09-07 ENCOUNTER — Encounter: Payer: BC Managed Care – PPO | Admitting: Obstetrics

## 2014-09-08 ENCOUNTER — Telehealth: Payer: Self-pay | Admitting: *Deleted

## 2014-09-08 NOTE — Telephone Encounter (Signed)
Patient state she is currently on her period and had to cancel her colposcopy appointment. Patient calling to reschedule the appointment. Patient has been rescheduled for September 21, 2014 @ 3:15 pm.

## 2014-09-13 ENCOUNTER — Encounter: Payer: BC Managed Care – PPO | Admitting: Obstetrics

## 2014-09-21 ENCOUNTER — Ambulatory Visit (INDEPENDENT_AMBULATORY_CARE_PROVIDER_SITE_OTHER): Payer: BC Managed Care – PPO | Admitting: Obstetrics

## 2014-09-21 ENCOUNTER — Encounter: Payer: Self-pay | Admitting: Obstetrics

## 2014-09-21 VITALS — BP 114/66 | HR 81 | Temp 98.4°F | Wt 161.0 lb

## 2014-09-21 DIAGNOSIS — R87613 High grade squamous intraepithelial lesion on cytologic smear of cervix (HGSIL): Secondary | ICD-10-CM | POA: Diagnosis not present

## 2014-09-21 DIAGNOSIS — Z01812 Encounter for preprocedural laboratory examination: Secondary | ICD-10-CM

## 2014-09-21 DIAGNOSIS — A749 Chlamydial infection, unspecified: Secondary | ICD-10-CM

## 2014-09-21 NOTE — Progress Notes (Signed)
Colposcopy Procedure Note  Indications: Pap smear 3 weeks ago showed: high-grade squamous intraepithelial neoplasia  (HGSIL-encompassing moderate and severe dysplasia). The is 1st pap smear. Prior cervical/vaginal disease: normal exam without visible pathology. Prior cervical treatment: no treatment.  Procedure Details  The risks and benefits of the procedure and Written informed consent obtained.  A time-out was performed confirming the patient, procedure and allergy status  Speculum placed in vagina and excellent visualization of cervix achieved, cervix swabbed x 3 with acetic acid solution.  Findings: Cervix: no visible lesions; SCJ visualized 360 degrees without lesions, endocervical curettage performed, cervical biopsies taken at 6 and 12 o'clock, specimen labelled and sent to pathology and hemostasis achieved with silver nitrate.   Vaginal inspection: normal without visible lesions. Vulvar colposcopy: vulvar colposcopy not performed.   Physical Exam   Specimens: ECC and cervical biopsies  Complications: none.  Plan: Specimens labelled and sent to Pathology. Will base further treatment on Pathology findings. Treatment options discussed with patient. Post biopsy instructions given to patient. Return to discuss Pathology results in 2 weeks.

## 2014-09-24 ENCOUNTER — Other Ambulatory Visit: Payer: Self-pay | Admitting: Obstetrics

## 2014-09-24 DIAGNOSIS — B3731 Acute candidiasis of vulva and vagina: Secondary | ICD-10-CM

## 2014-09-24 DIAGNOSIS — B373 Candidiasis of vulva and vagina: Secondary | ICD-10-CM

## 2014-09-24 LAB — SURESWAB, VAGINOSIS/VAGINITIS PLUS
Atopobium vaginae: NOT DETECTED Log (cells/mL)
C. PARAPSILOSIS, DNA: NOT DETECTED
C. TROPICALIS, DNA: NOT DETECTED
C. albicans, DNA: DETECTED — AB
C. glabrata, DNA: NOT DETECTED
C. trachomatis RNA, TMA: NOT DETECTED
Gardnerella vaginalis: NOT DETECTED Log (cells/mL)
LACTOBACILLUS SPECIES: NOT DETECTED Log (cells/mL)
MEGASPHAERA SPECIES: NOT DETECTED Log (cells/mL)
N. GONORRHOEAE RNA, TMA: NOT DETECTED
T. vaginalis RNA, QL TMA: NOT DETECTED

## 2014-09-24 MED ORDER — FLUCONAZOLE 150 MG PO TABS: 150.0000 mg | ORAL_TABLET | Freq: Once | ORAL | Status: DC

## 2014-09-28 ENCOUNTER — Telehealth: Payer: Self-pay | Admitting: *Deleted

## 2014-09-28 NOTE — Telephone Encounter (Signed)
Patient left message late yesterday- she was given the 1 dose pill for yeast infection and she thinks she may have had a reaction to it. She is itching all over. 4/19   9:26 Attempt to call patient- LM on VM- May be reaction to Diflucan if she has never used before- do not take again if she can not think of any other irritant she was exposed to- may use Benadryl for her symptoms. Please call office.

## 2014-10-05 ENCOUNTER — Encounter: Payer: Self-pay | Admitting: Obstetrics

## 2014-10-05 ENCOUNTER — Ambulatory Visit (INDEPENDENT_AMBULATORY_CARE_PROVIDER_SITE_OTHER): Payer: BC Managed Care – PPO | Admitting: Obstetrics

## 2014-10-05 VITALS — BP 115/74 | HR 70 | Temp 98.2°F

## 2014-10-05 DIAGNOSIS — R896 Abnormal cytological findings in specimens from other organs, systems and tissues: Secondary | ICD-10-CM

## 2014-10-05 DIAGNOSIS — IMO0002 Reserved for concepts with insufficient information to code with codable children: Secondary | ICD-10-CM

## 2014-10-05 NOTE — Progress Notes (Signed)
Patient ID: Daisy Flores, female   DOB: November 30, 1990, 24 y.o.   MRN: [id number]  Chief Complaint  Patient presents with  . Follow-up    colpo    HPI Daisy Flores is a 24 y.o. female.  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  Colposcopic directed biopsies and ECC done.  Presents today for results.  HPI  History reviewed. No pertinent past medical history.  History reviewed. No pertinent past surgical history.  Family History  Problem Relation Age of Onset  . Hypertension Mother   . Hypertension Father     Social History History  Substance Use Topics  . Smoking status: Never Smoker   . Smokeless tobacco: Not on file  . Alcohol Use: Yes     Comment: Occ.     Allergies  Allergen Reactions  . Diflucan [Fluconazole] Itching  . Penicillins Hives and Rash    PT STATES ALL TYPES OF CILLINS CAUSE RASHES AS WELL AS HIVES    Current Outpatient Prescriptions  Medication Sig Dispense Refill  . clarithromycin (BIAXIN) 500 MG tablet Take 500 mg by mouth 2 (two) times daily.  0  . cyclobenzaprine (FLEXERIL) 5 MG tablet Take 5 mg by mouth 3 (three) times daily as needed.  0  . fluconazole (DIFLUCAN) 150 MG tablet Take 1 tablet (150 mg total) by mouth once. 1 tablet 2  . ibuprofen (ADVIL,MOTRIN) 50 MG chewable tablet Up to 800 mg three times a day for cramps. 60 tablet 2  . LORazepam (ATIVAN) 1 MG tablet Take 1 tablet (1 mg total) by mouth every 8 (eight) hours as needed for anxiety. 10 tablet 0  . metroNIDAZOLE (METROGEL VAGINAL) 0.75 % vaginal gel Place 1 Applicatorful vaginally at bedtime. 70 g 0  . naproxen (NAPROSYN) 500 MG tablet Take 500 mg by mouth every 12 (twelve) hours.  0  . Norgestimate-Ethinyl Estradiol Triphasic (ORTHO TRI-CYCLEN LO) 0.18/0.215/0.25 MG-25 MCG tab Take 1 tablet by mouth daily. 1 Package 11   No current facility-administered medications for this visit.    Review of Systems Review of Systems Constitutional: negative for fatigue and weight loss Respiratory:  negative for cough and wheezing Cardiovascular: negative for chest pain, fatigue and palpitations Gastrointestinal: negative for abdominal pain and change in bowel habits Genitourinary:negative Integument/breast: negative for nipple discharge Musculoskeletal:negative for myalgias Neurological: negative for gait problems and tremors Behavioral/Psych: negative for abusive relationship, depression Endocrine: negative for temperature intolerance     Blood pressure 115/74, pulse 70, temperature 98.2 F (36.8 C), last menstrual period 09/05/2014.  Physical Exam Physical Exam:  Defered  100% of 10 min visit spent on counseling and coordination of care.   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  Probable over read.    Plan    Repeat pap in 6 months.  No orders of the defined types were placed in this encounter.   No orders of the defined types were placed in this encounter.

## 2014-10-27 ENCOUNTER — Other Ambulatory Visit (HOSPITAL_COMMUNITY): Payer: BC Managed Care – PPO

## 2014-10-28 ENCOUNTER — Encounter (HOSPITAL_COMMUNITY): Payer: Self-pay | Admitting: Cardiology

## 2014-12-09 ENCOUNTER — Telehealth: Payer: Self-pay | Admitting: *Deleted

## 2014-12-09 NOTE — Telephone Encounter (Signed)
Patient states she would like to try a different pill. She has not noticed a difference in her cycle or her acne since she started the OCP. Told patient I would let her provider know she would like to change and get her to send something different in.

## 2014-12-10 ENCOUNTER — Other Ambulatory Visit: Payer: Self-pay | Admitting: Certified Nurse Midwife

## 2014-12-10 MED ORDER — NORETHIN-ETH ESTRAD-FE BIPHAS 1 MG-10 MCG / 10 MCG PO TABS: 1.0000 | ORAL_TABLET | Freq: Every day | ORAL | Status: DC

## 2014-12-10 NOTE — Telephone Encounter (Signed)
Hi Daisy Flores; I sent in LoLo for her, if she would rather we could try a 3 month pill for her depending on her insurance, etc.  Thank you.

## 2014-12-17 NOTE — Telephone Encounter (Signed)
Patient notified- will check with her insurance regarding continuous pills.

## 2014-12-23 ENCOUNTER — Telehealth: Payer: Self-pay | Admitting: *Deleted

## 2014-12-23 NOTE — Telephone Encounter (Signed)
Patient called to discuss birth control and what she found out from her insurance. 7/14 10:15 LM on VM to CB

## 2015-03-01 ENCOUNTER — Telehealth: Payer: Self-pay | Admitting: *Deleted

## 2015-03-01 NOTE — Telephone Encounter (Signed)
Patient is taking LOLO Estrin and feel it is not working. She has had BTB for the last 2 cycle the third week of pills.  Patient wants to try [facility].

## 2015-03-02 NOTE — Telephone Encounter (Signed)
LM on VM- gave patient options regarding her birth control- Long acting she may have BTB for quite some time before she regulates or we can increase her dose of her Lo Estrin. Recommend patient call back to let us know what she wants to do.

## 2015-03-02 NOTE — Telephone Encounter (Signed)
Would recommend Loestrin 1.5 - 30.

## 2015-03-21 NOTE — Telephone Encounter (Signed)
No response- refile call 

## 2015-04-07 ENCOUNTER — Ambulatory Visit: Payer: BC Managed Care – PPO | Admitting: Obstetrics

## 2015-04-21 ENCOUNTER — Ambulatory Visit (INDEPENDENT_AMBULATORY_CARE_PROVIDER_SITE_OTHER): Payer: BC Managed Care – PPO | Admitting: Obstetrics

## 2015-04-21 ENCOUNTER — Encounter: Payer: Self-pay | Admitting: Obstetrics

## 2015-04-21 VITALS — BP 117/82 | HR 96 | Temp 98.6°F | Wt 158.0 lb

## 2015-04-21 DIAGNOSIS — R87613 High grade squamous intraepithelial lesion on cytologic smear of cervix (HGSIL): Secondary | ICD-10-CM

## 2015-04-21 DIAGNOSIS — IMO0002 Reserved for concepts with insufficient information to code with codable children: Secondary | ICD-10-CM

## 2015-04-21 DIAGNOSIS — R896 Abnormal cytological findings in specimens from other organs, systems and tissues: Secondary | ICD-10-CM | POA: Diagnosis not present

## 2015-04-21 DIAGNOSIS — A749 Chlamydial infection, unspecified: Secondary | ICD-10-CM | POA: Diagnosis not present

## 2015-04-21 NOTE — Addendum Note (Signed)
Addended by: Marya LandryFOSTER, SUZANNE D on: 04/21/2015 05:24 PM   Modules accepted: Orders

## 2015-04-21 NOTE — Progress Notes (Addendum)
Patient ID: Daisy Flores, female   DOB: 1990-08-07, 24 y.o.   MRN: [id number]  Chief Complaint  Patient presents with  . Follow-up    Repeat Pap  Colpo in April 2016-LGSIL    HPI Daisy Flores is a 24 y.o. female.  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  Thought to possibly be over-read on pap. Pap smear repeated today.  Will probably need Cryo. since there is a mismatch between pap and colposcopy.  Complains of starting to bleed a week before placebo pills in her OCP pack.  On Lo Lo Estrin 24.   HPI  History reviewed. No pertinent past medical history.  History reviewed. No pertinent past surgical history.  Family History  Problem Relation Age of Onset  . Hypertension Mother   . Hypertension Father     Social History Social History  Substance Use Topics  . Smoking status: Never Smoker   . Smokeless tobacco: None  . Alcohol Use: 0.0 oz/week    0 Standard drinks or equivalent per week     Comment: Occ.     Allergies  Allergen Reactions  . Diflucan [Fluconazole] Itching  . Penicillins Hives and Rash    PT STATES ALL TYPES OF CILLINS CAUSE RASHES AS WELL AS HIVES    Current Outpatient Prescriptions  Medication Sig Dispense Refill  . cyclobenzaprine (FLEXERIL) 5 MG tablet Take 5 mg by mouth 3 (three) times daily as needed.  0  . LORazepam (ATIVAN) 1 MG tablet Take 1 tablet (1 mg total) by mouth every 8 (eight) hours as needed for anxiety. 10 tablet 0  . Norethindrone-Ethinyl Estradiol-Fe Biphas (LO LOESTRIN FE) 1 MG-10 MCG / 10 MCG tablet Take 1 tablet by mouth daily. 1 Package 11   No current facility-administered medications for this visit.    Review of Systems Review of Systems Constitutional: negative for fatigue and weight loss Respiratory: negative for cough and wheezing Cardiovascular: negative for chest pain, fatigue and palpitations Gastrointestinal: negative for abdominal pain and change in bowel habits Genitourinary: positive for BTB on  OCP's Integument/breast: negative for nipple discharge Musculoskeletal:negative for myalgias Neurological: negative for gait problems and tremors Behavioral/Psych: negative for abusive relationship, depression Endocrine: negative for temperature intolerance     Blood pressure 117/82, pulse 96, temperature 98.6 F (37 C), weight 158 lb (71.668 kg), last menstrual period 04/04/2015.  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  Late BTB on OCP's.    Plan    Pap Smear repeated today  Will probably need Cryocautery of cervix because of mismatch between Pap and Colposcopy Chlamydia TOC cultures and wet prep done today Will try Lo Lo Estrin a little longer and increase to Loestrin 1.5 / 30 if BTB continues.   No orders of the defined types were placed in this encounter.   No orders of the defined types were placed in this encounter.

## 2015-04-22 LAB — PAP IG W/ RFLX HPV ASCU

## 2015-04-25 LAB — SURESWAB, VAGINOSIS/VAGINITIS PLUS
ATOPOBIUM VAGINAE: 6.4 Log (cells/mL)
C. ALBICANS, DNA: NOT DETECTED
C. TRACHOMATIS RNA, TMA: NOT DETECTED
C. glabrata, DNA: NOT DETECTED
C. parapsilosis, DNA: NOT DETECTED
C. tropicalis, DNA: NOT DETECTED
Gardnerella vaginalis: >8 Log (cells/mL)
LACTOBACILLUS SPECIES: NOT DETECTED Log (cells/mL)
MEGASPHAERA SPECIES: 7.8 Log (cells/mL)
N. GONORRHOEAE RNA, TMA: NOT DETECTED
T. VAGINALIS RNA, QL TMA: NOT DETECTED

## 2015-04-26 ENCOUNTER — Other Ambulatory Visit: Payer: Self-pay | Admitting: Obstetrics

## 2015-04-26 DIAGNOSIS — N760 Acute vaginitis: Principal | ICD-10-CM

## 2015-04-26 DIAGNOSIS — B9689 Other specified bacterial agents as the cause of diseases classified elsewhere: Secondary | ICD-10-CM

## 2015-04-26 LAB — HUMAN PAPILLOMAVIRUS, HIGH RISK: HPV DNA High Risk: DETECTED — AB

## 2015-04-26 MED ORDER — METRONIDAZOLE 500 MG PO TABS: 500.0000 mg | ORAL_TABLET | Freq: Two times a day (BID) | ORAL | Status: DC

## 2015-05-11 ENCOUNTER — Ambulatory Visit (INDEPENDENT_AMBULATORY_CARE_PROVIDER_SITE_OTHER): Payer: BC Managed Care – PPO | Admitting: Obstetrics

## 2015-05-11 VITALS — BP 94/65 | HR 94 | Wt 159.0 lb

## 2015-05-11 DIAGNOSIS — R896 Abnormal cytological findings in specimens from other organs, systems and tissues: Secondary | ICD-10-CM

## 2015-05-11 DIAGNOSIS — IMO0002 Reserved for concepts with insufficient information to code with codable children: Secondary | ICD-10-CM

## 2015-05-12 ENCOUNTER — Encounter: Payer: Self-pay | Admitting: Obstetrics

## 2015-05-12 NOTE — Progress Notes (Signed)
Patient ID: Daisy Flores, female   DOB: 1991/04/08, 24 y.o.   MRN: [id number]  Chief Complaint  Patient presents with  . Advice Only    pap results    HPI Daisy Nortonshley Steyer is a 24 y.o. female.  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  Presents for results and management plans. HPI  No past medical history on file.  No past surgical history on file.  Family History  Problem Relation Age of Onset  . Hypertension Mother   . Hypertension Father     Social History Social History  Substance Use Topics  . Smoking status: Never Smoker   . Smokeless tobacco: Not on file  . Alcohol Use: 0.0 oz/week    0 Standard drinks or equivalent per week     Comment: Occ.     Allergies  Allergen Reactions  . Diflucan [Fluconazole] Itching  . Penicillins Hives and Rash    PT STATES ALL TYPES OF CILLINS CAUSE RASHES AS WELL AS HIVES    Current Outpatient Prescriptions  Medication Sig Dispense Refill  . cyclobenzaprine (FLEXERIL) 5 MG tablet Take 5 mg by mouth 3 (three) times daily as needed.  0  . LORazepam (ATIVAN) 1 MG tablet Take 1 tablet (1 mg total) by mouth every 8 (eight) hours as needed for anxiety. 10 tablet 0  . metroNIDAZOLE (FLAGYL) 500 MG tablet Take 1 tablet (500 mg total) by mouth 2 (two) times daily. 14 tablet 2  . Norethindrone-Ethinyl Estradiol-Fe Biphas (LO LOESTRIN FE) 1 MG-10 MCG / 10 MCG tablet Take 1 tablet by mouth daily. 1 Package 11   No current facility-administered medications for this visit.    Review of Systems Review of Systems Constitutional: negative for fatigue and weight loss Respiratory: negative for cough and wheezing Cardiovascular: negative for chest pain, fatigue and palpitations Gastrointestinal: negative for abdominal pain and change in bowel habits Genitourinary:negative Integument/breast: negative for nipple discharge Musculoskeletal:negative for myalgias Neurological: negative for gait problems and  tremors Behavioral/Psych: negative for abusive relationship, depression Endocrine: negative for temperature intolerance     Blood pressure 94/65, pulse 94, weight 159 lb (72.122 kg), last menstrual period 04/04/2015.  Physical Exam Physical Exam: Deferred  100% of 10 min visit spent on counseling and coordination of care.   Data Reviewed Pap Colposcopy  Assessment     LGSIL     Plan    Repeat pap in 1 year.   No orders of the defined types were placed in this encounter.   No orders of the defined types were placed in this encounter.

## 2015-08-18 ENCOUNTER — Telehealth: Payer: Self-pay | Admitting: *Deleted

## 2015-08-18 NOTE — Telephone Encounter (Signed)
Patient is using the OCP and she is experiencing bleeding during the 3rd week of her pill pack making her have a 2 week cycle every month. We have tried to stop this. Please review for plan and let her know what she needs to do next.

## 2015-08-19 ENCOUNTER — Other Ambulatory Visit: Payer: Self-pay | Admitting: Obstetrics

## 2015-08-19 DIAGNOSIS — Z3041 Encounter for surveillance of contraceptive pills: Secondary | ICD-10-CM

## 2015-08-19 MED ORDER — NORETHINDRONE-ETH ESTRADIOL 1-35 MG-MCG PO TABS: 1.0000 | ORAL_TABLET | Freq: Every day | ORAL | Status: DC

## 2015-11-15 ENCOUNTER — Other Ambulatory Visit: Payer: Self-pay | Admitting: Obstetrics

## 2016-04-04 ENCOUNTER — Other Ambulatory Visit: Payer: Self-pay | Admitting: Obstetrics

## 2016-05-14 ENCOUNTER — Encounter: Payer: Self-pay | Admitting: Obstetrics

## 2016-05-14 ENCOUNTER — Ambulatory Visit (INDEPENDENT_AMBULATORY_CARE_PROVIDER_SITE_OTHER): Payer: BC Managed Care – PPO | Admitting: Obstetrics

## 2016-05-14 DIAGNOSIS — Z01419 Encounter for gynecological examination (general) (routine) without abnormal findings: Secondary | ICD-10-CM | POA: Diagnosis not present

## 2016-05-14 DIAGNOSIS — Z3041 Encounter for surveillance of contraceptive pills: Secondary | ICD-10-CM

## 2016-05-14 DIAGNOSIS — Z124 Encounter for screening for malignant neoplasm of cervix: Secondary | ICD-10-CM

## 2016-05-14 NOTE — Addendum Note (Signed)
Addended by: Francene FindersJAMES, QUINETTA C on: 05/14/2016 05:00 PM   Modules accepted: Orders

## 2016-05-14 NOTE — Progress Notes (Addendum)
Subjective:        Daisy Flores is a 25 y.o. female here for a routine exam.  Current complaints: Decreased libido since starting OCP's  Personal health questionnaire:  Is patient Ashkenazi Jewish, have a family history of breast and/or ovarian cancer: no Is there a family history of uterine cancer diagnosed at age < 250, gastrointestinal cancer, urinary tract cancer, family member who is a Personnel officerLynch syndrome-associated carrier: no Is the patient overweight and hypertensive, family history of diabetes, personal history of gestational diabetes, preeclampsia or PCOS: no Is patient over 6855, have PCOS,  family history of premature CHD under age 25, diabetes, smoke, have hypertension or peripheral artery disease:  no At any time, has a partner hit, kicked or otherwise hurt or frightened you?: no Over the past 2 weeks, have you felt down, depressed or hopeless?: no Over the past 2 weeks, have you felt little interest or pleasure in doing things?:no   Gynecologic History Patient's last menstrual period was 05/04/2016 (approximate). Contraception: OCP (estrogen/progesterone) Last Pap: 2016 . Results were: ASCUS with positive HPV Last mammogram: n/a. Results were: n/a  Obstetric History OB History  No data available    History reviewed. No pertinent past medical history.  History reviewed. No pertinent surgical history.   Current Outpatient Prescriptions:  .  cyclobenzaprine (FLEXERIL) 5 MG tablet, Take 5 mg by mouth 3 (three) times daily as needed., Disp: , Rfl: 0 .  LO LOESTRIN FE 1 MG-10 MCG / 10 MCG tablet, TAKE 1 TABLET BY MOUTH DAILY, Disp: 28 tablet, Rfl: 4 .  LORazepam (ATIVAN) 1 MG tablet, Take 1 tablet (1 mg total) by mouth every 8 (eight) hours as needed for anxiety., Disp: 10 tablet, Rfl: 0 .  metroNIDAZOLE (FLAGYL) 500 MG tablet, Take 1 tablet (500 mg total) by mouth 2 (two) times daily., Disp: 14 tablet, Rfl: 2 .  norethindrone-ethinyl estradiol 1/35 (ORTHO-NOVUM 1/35, 28,)  tablet, Take 1 tablet by mouth daily., Disp: 1 Package, Rfl: 11 Allergies  Allergen Reactions  . Diflucan [Fluconazole] Itching  . Penicillins Hives and Rash    PT STATES ALL TYPES OF CILLINS CAUSE RASHES AS WELL AS HIVES    Social History  Substance Use Topics  . Smoking status: Never Smoker  . Smokeless tobacco: Never Used  . Alcohol use 0.0 oz/week     Comment: Occ.     Family History  Problem Relation Age of Onset  . Hypertension Mother   . Hypertension Father       Review of Systems  Constitutional: negative for fatigue and weight loss Respiratory: negative for cough and wheezing Cardiovascular: negative for chest pain, fatigue and palpitations Gastrointestinal: negative for abdominal pain and change in bowel habits Musculoskeletal:negative for myalgias Neurological: negative for gait problems and tremors Behavioral/Psych: negative for abusive relationship, depression Endocrine: negative for temperature intolerance    Genitourinary:negative for abnormal menstrual periods, genital lesions, hot flashes, sexual problems and vaginal discharge Integument/breast: negative for breast lump, breast tenderness, nipple discharge and skin lesion(s)    Objective:       BP 112/80   Pulse 69   Temp 98.6 F (37 C) (Oral)   Wt 172 lb 12.8 oz (78.4 kg)   LMP 05/04/2016 (Approximate)   BMI 30.61 kg/m  General:   alert  Skin:   no rash or abnormalities  Lungs:   clear to auscultation bilaterally  Heart:   regular rate and rhythm, S1, S2 normal, no murmur, click, rub or gallop  Breasts:  normal without suspicious masses, skin or nipple changes or axillary nodes  Abdomen:  normal findings: no organomegaly, soft, non-tender and no hernia  Pelvis:  External genitalia: normal general appearance Urinary system: urethral meatus normal and bladder without fullness, nontender Vaginal: normal without tenderness, induration or masses Cervix: normal appearance Adnexa: normal bimanual  exam Uterus: anteverted and non-tender, normal size   Lab Review Urine pregnancy test Labs reviewed yes Radiologic studies reviewed no  50% of 20 min visit spent on counseling and coordination of care.    Assessment:    Healthy female exam.    Decreased Libido   Plan:    Recommended consultation for some of the herbal products for decreased libido.  Probably related to OCP's and should resolve.  Continue Lo Loestrin  Education reviewed: low fat, low cholesterol diet, safe sex/STD prevention, self breast exams and weight bearing exercise. Contraception: OCP (estrogen/progesterone). Follow up in: 1 year.   No orders of the defined types were placed in this encounter.  No orders of the defined types were placed in this encounter.     Patient ID: Daisy Flores, female   DOB: 1990-06-14, [age] y.o.   MRN: [id number]

## 2016-05-17 LAB — CYTOLOGY - PAP

## 2016-05-18 LAB — NUSWAB VG+, CANDIDA 6SP
CANDIDA ALBICANS, NAA: NEGATIVE
CHLAMYDIA TRACHOMATIS, NAA: NEGATIVE
Candida glabrata, NAA: NEGATIVE
Candida krusei, NAA: NEGATIVE
Candida lusitaniae, NAA: NEGATIVE
Candida parapsilosis, NAA: NEGATIVE
Candida tropicalis, NAA: NEGATIVE
NEISSERIA GONORRHOEAE, NAA: NEGATIVE
TRICH VAG BY NAA: NEGATIVE

## 2016-05-20 IMAGING — DX DG CHEST 2V
2 series · 2 of 2 positions shown · non-contrast
Comparison: None.

CLINICAL DATA: Palpitations

EXAM:
CHEST  2 VIEW

[chest pa]
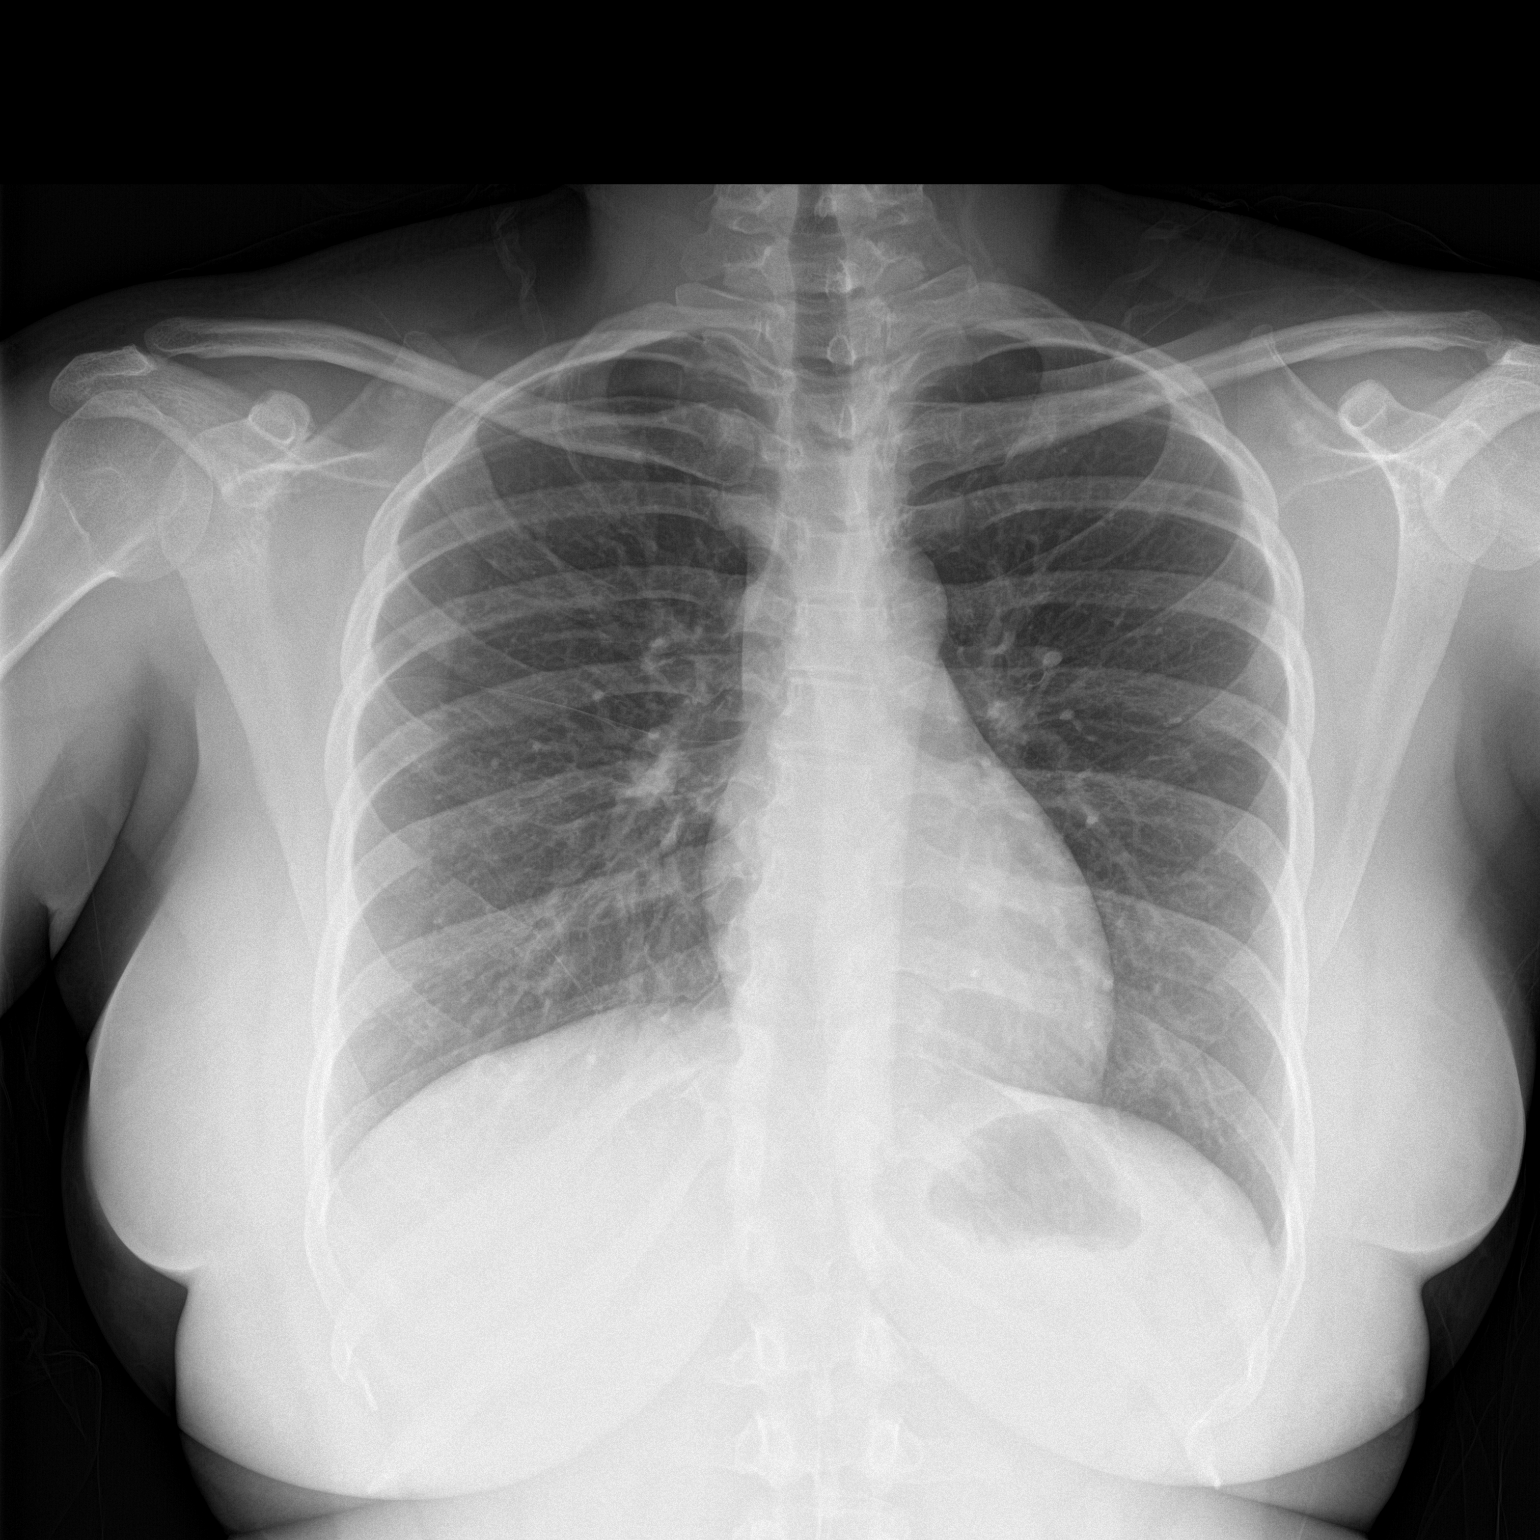

[chest lat]
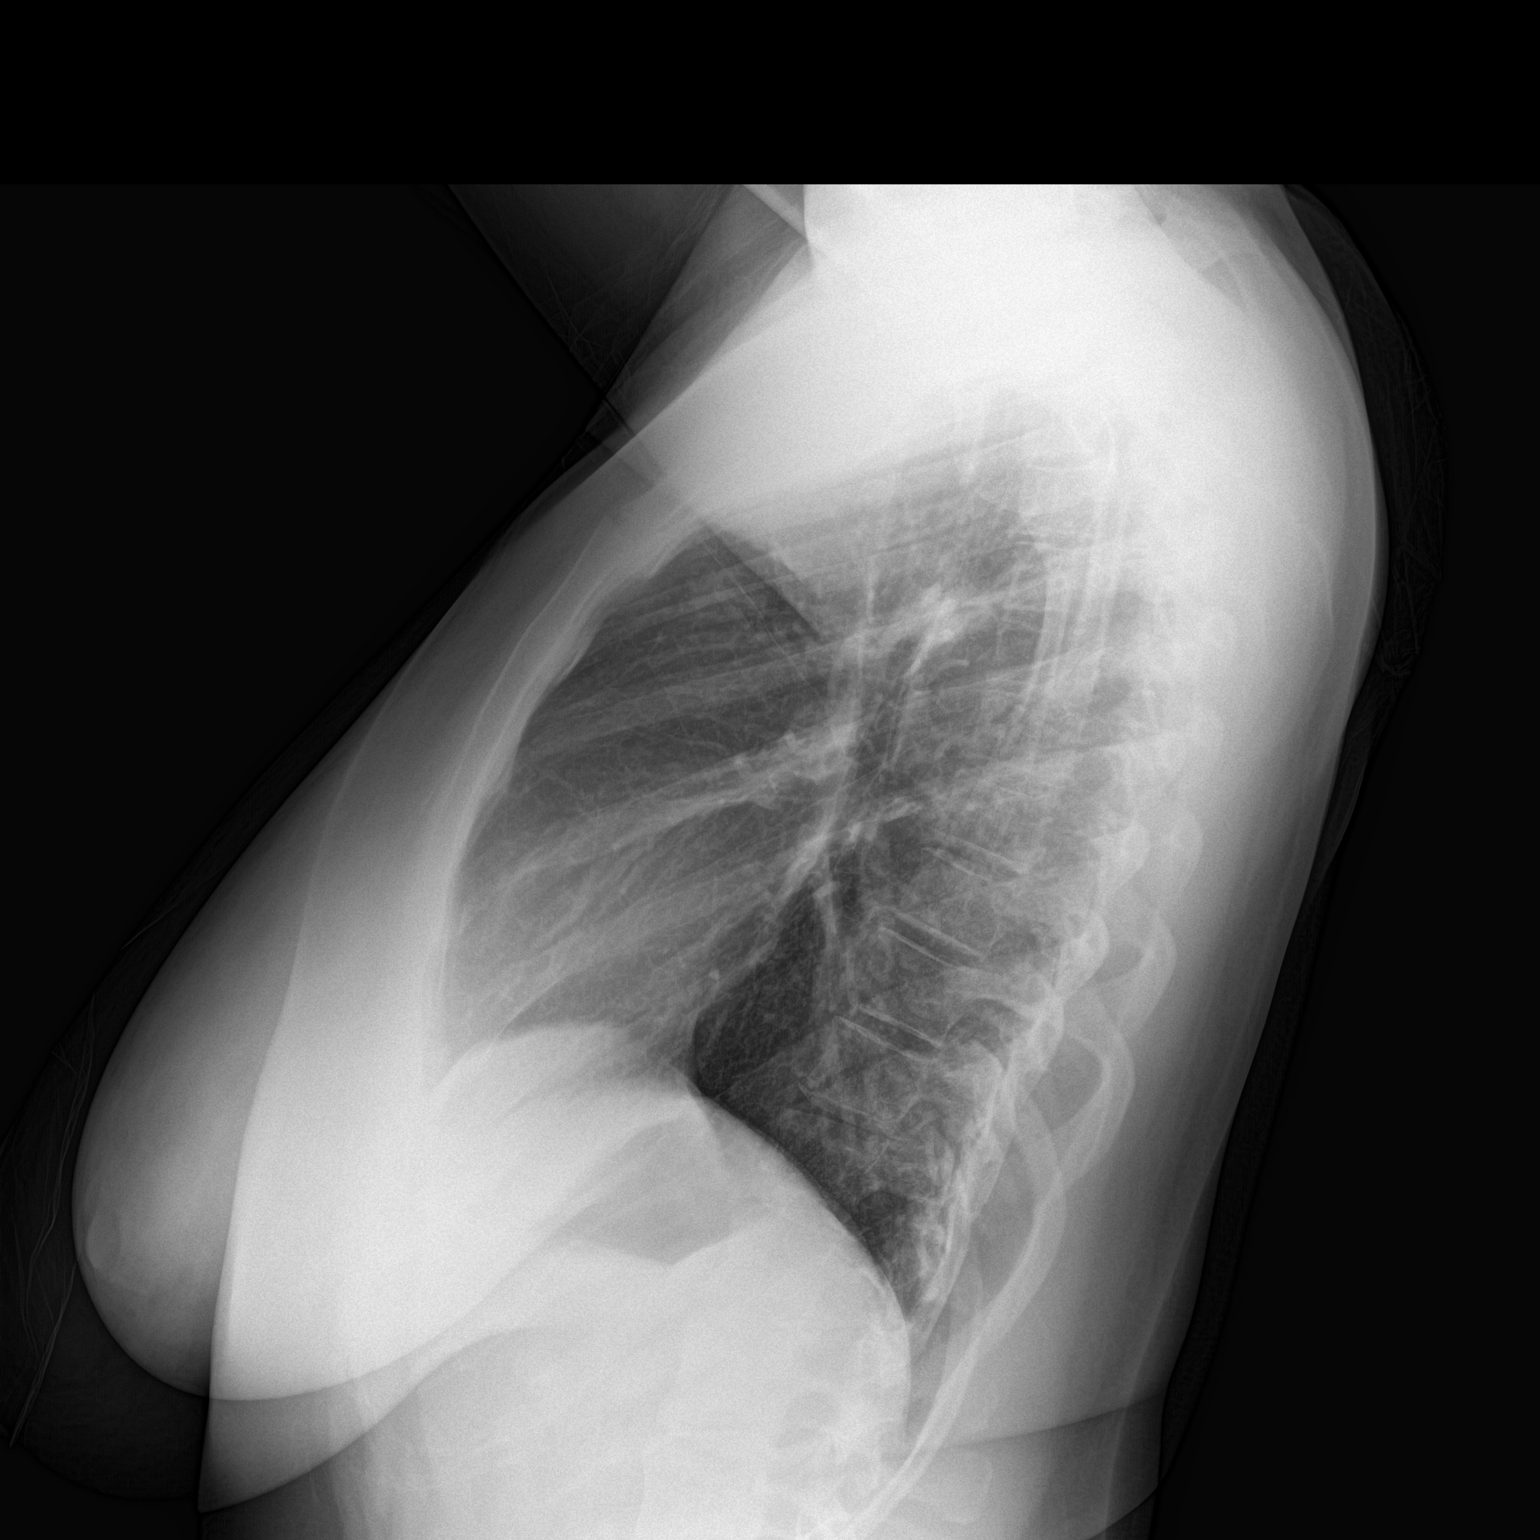

[2 of 2 positions shown; findings below may reference images not displayed]

FINDINGS: Cardiomediastinal silhouette is unremarkable. No acute infiltrate or
pleural effusion. No pulmonary edema. Bony thorax is unremarkable.
IMPRESSION: No active cardiopulmonary disease.

## 2016-06-21 ENCOUNTER — Ambulatory Visit: Payer: BC Managed Care – PPO | Admitting: Obstetrics

## 2016-06-25 ENCOUNTER — Ambulatory Visit (INDEPENDENT_AMBULATORY_CARE_PROVIDER_SITE_OTHER): Payer: BC Managed Care – PPO | Admitting: Obstetrics

## 2016-06-25 ENCOUNTER — Encounter: Payer: Self-pay | Admitting: Obstetrics

## 2016-06-25 VITALS — BP 119/80 | HR 85 | Wt 175.6 lb

## 2016-06-25 DIAGNOSIS — R87612 Low grade squamous intraepithelial lesion on cytologic smear of cervix (LGSIL): Secondary | ICD-10-CM

## 2016-06-25 NOTE — Progress Notes (Signed)
Patient ID: Daisy Flores, female   DOB: Oct 14, 1990, 26 y.o.   MRN: [id number]  Chief Complaint  Patient presents with  . Advice Only    Patient is in the office to discuss her pap results and her treatment options.    HPI Daisy Flores is a 26 y.o. female.  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  Presents for 1st post colposcopy pap smear result and management recommendations. HPI  No past medical history on file.  No past surgical history on file.  Family History  Problem Relation Age of Onset  . Hypertension Mother   . Hypertension Father     Social History Social History  Substance Use Topics  . Smoking status: Never Smoker  . Smokeless tobacco: Never Used  . Alcohol use 0.0 oz/week     Comment: Occ.     Allergies  Allergen Reactions  . Diflucan [Fluconazole] Itching  . Penicillins Hives and Rash    PT STATES ALL TYPES OF CILLINS CAUSE RASHES AS WELL AS HIVES    Current Outpatient Prescriptions  Medication Sig Dispense Refill  . cyclobenzaprine (FLEXERIL) 5 MG tablet Take 5 mg by mouth 3 (three) times daily as needed.  0  . LO LOESTRIN FE 1 MG-10 MCG / 10 MCG tablet TAKE 1 TABLET BY MOUTH DAILY 28 tablet 4  . LORazepam (ATIVAN) 1 MG tablet Take 1 tablet (1 mg total) by mouth every 8 (eight) hours as needed for anxiety. 10 tablet 0   No current facility-administered medications for this visit.     Review of Systems Review of Systems Constitutional: negative for fatigue and weight loss Respiratory: negative for cough and wheezing Cardiovascular: negative for chest pain, fatigue and palpitations Gastrointestinal: negative for abdominal pain and change in bowel habits Genitourinary:negative Integument/breast: negative for nipple discharge Musculoskeletal:negative for myalgias Neurological: negative for gait problems and tremors Behavioral/Psych: negative for abusive relationship, depression Endocrine: negative for  temperature intolerance      Blood pressure 119/80, pulse 85, weight 175 lb 9.6 oz (79.7 kg), last menstrual period 05/30/2016.  Physical Exam Physical Exam :  Deferred >50% of 10 min visit spent on counseling and coordination of care.    Data Reviewed Pap Smear:  LGSIL  Assessment     LGSIL    Plan    Repeat pap 1 year  No orders of the defined types were placed in this encounter.  No orders of the defined types were placed in this encounter.

## 2016-08-22 ENCOUNTER — Other Ambulatory Visit: Payer: Self-pay | Admitting: Obstetrics

## 2017-01-03 ENCOUNTER — Other Ambulatory Visit: Payer: Self-pay | Admitting: Obstetrics

## 2017-05-22 ENCOUNTER — Other Ambulatory Visit: Payer: Self-pay | Admitting: Obstetrics

## 2017-08-27 ENCOUNTER — Encounter: Payer: Self-pay | Admitting: *Deleted

## 2017-10-10 ENCOUNTER — Other Ambulatory Visit: Payer: Self-pay | Admitting: Obstetrics

## 2018-06-24 ENCOUNTER — Encounter: Payer: Self-pay | Admitting: Obstetrics

## 2018-06-24 ENCOUNTER — Ambulatory Visit (INDEPENDENT_AMBULATORY_CARE_PROVIDER_SITE_OTHER): Payer: BC Managed Care – PPO | Admitting: Obstetrics

## 2018-06-24 VITALS — BP 115/79 | HR 96 | Wt 155.0 lb

## 2018-06-24 DIAGNOSIS — Z01419 Encounter for gynecological examination (general) (routine) without abnormal findings: Secondary | ICD-10-CM | POA: Diagnosis not present

## 2018-06-24 DIAGNOSIS — R102 Pelvic and perineal pain: Secondary | ICD-10-CM | POA: Diagnosis not present

## 2018-06-24 DIAGNOSIS — N898 Other specified noninflammatory disorders of vagina: Secondary | ICD-10-CM | POA: Diagnosis not present

## 2018-06-24 DIAGNOSIS — Z113 Encounter for screening for infections with a predominantly sexual mode of transmission: Secondary | ICD-10-CM | POA: Diagnosis not present

## 2018-06-24 DIAGNOSIS — B373 Candidiasis of vulva and vagina: Secondary | ICD-10-CM | POA: Diagnosis not present

## 2018-06-24 DIAGNOSIS — Z124 Encounter for screening for malignant neoplasm of cervix: Secondary | ICD-10-CM

## 2018-06-24 LAB — POCT URINALYSIS DIPSTICK
Bilirubin, UA: NEGATIVE
Blood, UA: NEGATIVE
GLUCOSE UA: NEGATIVE
Ketones, UA: NEGATIVE
LEUKOCYTES UA: NEGATIVE
Nitrite, UA: NEGATIVE
Odor: NEGATIVE
Protein, UA: NEGATIVE
SPEC GRAV UA: 1.01 (ref 1.010–1.025)
Urobilinogen, UA: 0.2 E.U./dL
pH, UA: 6.5 (ref 5.0–8.0)

## 2018-06-24 LAB — POCT URINE PREGNANCY: Preg Test, Ur: NEGATIVE

## 2018-06-24 NOTE — Progress Notes (Signed)
Pt presents for annual, pap, all STD testing.  Pt c/o pelvic pain since November 2019.  No full period since October 2019; she had spotting x 4 days last week.   Has history of back injury that could be causing pain.

## 2018-06-25 ENCOUNTER — Encounter: Payer: Self-pay | Admitting: Obstetrics

## 2018-06-25 LAB — HEPATITIS C ANTIBODY

## 2018-06-25 LAB — CERVICOVAGINAL ANCILLARY ONLY
Bacterial vaginitis: NEGATIVE
CHLAMYDIA, DNA PROBE: NEGATIVE
Candida vaginitis: POSITIVE — AB
NEISSERIA GONORRHEA: NEGATIVE
Trichomonas: NEGATIVE

## 2018-06-25 LAB — RPR: RPR Ser Ql: NONREACTIVE

## 2018-06-25 LAB — HIV ANTIBODY (ROUTINE TESTING W REFLEX): HIV Screen 4th Generation wRfx: NONREACTIVE

## 2018-06-25 LAB — HEPATITIS B SURFACE ANTIGEN: Hepatitis B Surface Ag: NEGATIVE

## 2018-06-25 NOTE — Progress Notes (Addendum)
Subjective:        Daisy Flores is a 28 y.o. female here for a routine exam.  Current complaints: Irregular periods and pelvic pain.    Personal health questionnaire:  Is patient Ashkenazi Jewish, have a family history of breast and/or ovarian cancer: no Is there a family history of uterine cancer diagnosed at age < 41, gastrointestinal cancer, urinary tract cancer, family member who is a Personnel officer syndrome-associated carrier: no Is the patient overweight and hypertensive, family history of diabetes, personal history of gestational diabetes, preeclampsia or PCOS: no Is patient over 36, have PCOS,  family history of premature CHD under age 86, diabetes, smoke, have hypertension or peripheral artery disease:  no At any time, has a partner hit, kicked or otherwise hurt or frightened you?: no Over the past 2 weeks, have you felt down, depressed or hopeless?: no Over the past 2 weeks, have you felt little interest or pleasure in doing things?:no   Gynecologic History Patient's last menstrual period was 03/11/2018. Contraception: OCP (estrogen/progesterone) Last Pap: 2017. Results were: LGSIL Last mammogram: n/a. Results were: n/a  Obstetric History OB History  Gravida Para Term Preterm AB Living  0 0 0 0 0 0  SAB TAB Ectopic Multiple Live Births  0 0 0 0 0    History reviewed. No pertinent past medical history.  History reviewed. No pertinent surgical history.   Current Outpatient Medications:  .  LO LOESTRIN FE 1 MG-10 MCG / 10 MCG tablet, TAKE 1 TABLET BY MOUTH DAILY, Disp: 28 tablet, Rfl: 11 .  cyclobenzaprine (FLEXERIL) 5 MG tablet, Take 5 mg by mouth 3 (three) times daily as needed., Disp: , Rfl: 0 .  LORazepam (ATIVAN) 1 MG tablet, Take 1 tablet (1 mg total) by mouth every 8 (eight) hours as needed for anxiety. (Patient not taking: Reported on 06/24/2018), Disp: 10 tablet, Rfl: 0 Allergies  Allergen Reactions  . Diflucan [Fluconazole] Itching  . Penicillins Hives and Rash    PT STATES ALL TYPES OF CILLINS CAUSE RASHES AS WELL AS HIVES    Social History   Tobacco Use  . Smoking status: Never Smoker  . Smokeless tobacco: Never Used  Substance Use Topics  . Alcohol use: Yes    Alcohol/week: 0.0 standard drinks    Comment: Occ.     Family History  Problem Relation Age of Onset  . Hypertension Mother   . Hypertension Father       Review of Systems  Constitutional: negative for fatigue and weight loss Respiratory: negative for cough and wheezing Cardiovascular: negative for chest pain, fatigue and palpitations Gastrointestinal: negative for abdominal pain and change in bowel habits Musculoskeletal:POSITIVE for myalgias Neurological: negative for gait problems and tremors Behavioral/Psych: negative for abusive relationship, depression Endocrine: negative for temperature intolerance    Genitourinary:POSITIVE for abnormal menstrual periods and pelvic pain Integument/breast: negative for breast lump, breast tenderness, nipple discharge and skin lesion(s)    Objective:       BP 115/79   Pulse 96   Wt 155 lb (70.3 kg)   LMP 03/11/2018   HC 63" (160 cm)   BMI 27.46 kg/m  General:   alert  Skin:   no rash or abnormalities  Lungs:   clear to auscultation bilaterally  Heart:   regular rate and rhythm, S1, S2 normal, no murmur, click, rub or gallop  Breasts:   normal without suspicious masses, skin or nipple changes or axillary nodes  Abdomen:  normal findings: no organomegaly, soft,  non-tender and no hernia  Pelvis:  External genitalia: normal general appearance Urinary system: urethral meatus normal and bladder without fullness, nontender Vaginal: normal without tenderness, induration or masses Cervix: normal appearance Adnexa: normal bimanual exam Uterus: anteverted and non-tender, normal size   Lab Review Urine pregnancy test Labs reviewed yes Radiologic studies reviewed yes  50% of 20 min visit spent on counseling and coordination of  care.   Assessment:     1. Encounter for routine gynecological examination with Papanicolaou smear of cervix Rx: - Cytology - PAP( [place])  2. Pelvic pain Rx: - POCT Urinalysis Dipstick - POCT urine pregnancy - US PELVIC COMPLETE WITH TRANSVAGINAL; Future  3. Vaginal discharge Rx: - Cervicovaginal ancillary only( [place])  4. Screen for STD (sexually transmitted disease) Rx: - Hepatitis B surface antigen - Hepatitis C antibody - HIV Antibody (routine testing w rflx) - RPR    Plan:    Education reviewed: calcium supplements, depression evaluation, low fat, low cholesterol diet, safe sex/STD prevention, self breast exams and weight bearing exercise. Contraception: OCP (estrogen/progesterone). Follow up in: 2 weeks.   No orders of the defined types were placed in this encounter.  Orders Placed This Encounter  Procedures  . US PELVIC COMPLETE WITH TRANSVAGINAL    Epic order Wt 155/ no needs Ins- bcbs  Pda/ anetra @[facility]  [phone]     Standing Status:   Future    Standing Expiration Date:   08/23/2019    Order Specific Question:   Reason for Exam (SYMPTOM  OR DIAGNOSIS REQUIRED)    Answer:   Pelvic pain    Order Specific Question:   Preferred imaging location?    Answer:   [facility]  . Hepatitis B surface antigen  . Hepatitis C antibody  . HIV Antibody (routine testing w rflx)  . RPR  . POCT Urinalysis Dipstick  . POCT urine pregnancy     Brock Bad MD 06-24-2018

## 2018-06-26 ENCOUNTER — Other Ambulatory Visit: Payer: Self-pay | Admitting: Obstetrics

## 2018-06-26 DIAGNOSIS — B373 Candidiasis of vulva and vagina: Secondary | ICD-10-CM

## 2018-06-26 DIAGNOSIS — B3731 Acute candidiasis of vulva and vagina: Secondary | ICD-10-CM

## 2018-06-26 MED ORDER — CLOTRIMAZOLE 2 % VA CREA: 1.0000 | TOPICAL_CREAM | Freq: Every day | VAGINAL | 0 refills | Status: AC

## 2018-06-27 LAB — CYTOLOGY - PAP
ADEQUACY: ABSENT
Diagnosis: NEGATIVE

## 2018-07-01 ENCOUNTER — Ambulatory Visit
Admission: RE | Admit: 2018-07-01 | Discharge: 2018-07-01 | Disposition: A | Discharge status: Home / Self Care | Payer: BC Managed Care – PPO | Source: Ambulatory Visit | Attending: Obstetrics | Admitting: Obstetrics

## 2018-07-01 DIAGNOSIS — R102 Pelvic and perineal pain: Secondary | ICD-10-CM

## 2018-07-06 ENCOUNTER — Other Ambulatory Visit: Payer: Self-pay | Admitting: Obstetrics

## 2018-07-06 DIAGNOSIS — B373 Candidiasis of vulva and vagina: Secondary | ICD-10-CM

## 2018-07-06 DIAGNOSIS — B3731 Acute candidiasis of vulva and vagina: Secondary | ICD-10-CM

## 2018-07-06 MED ORDER — TERCONAZOLE 0.4 % VA CREA: 1.0000 | TOPICAL_CREAM | Freq: Every day | VAGINAL | 0 refills | Status: AC

## 2018-07-08 ENCOUNTER — Ambulatory Visit (INDEPENDENT_AMBULATORY_CARE_PROVIDER_SITE_OTHER): Payer: BC Managed Care – PPO | Admitting: Obstetrics

## 2018-07-08 VITALS — BP 117/74 | HR 86 | Wt 154.0 lb

## 2018-07-08 DIAGNOSIS — R102 Pelvic and perineal pain: Secondary | ICD-10-CM

## 2018-07-09 ENCOUNTER — Encounter: Payer: Self-pay | Admitting: Obstetrics

## 2018-07-09 NOTE — Progress Notes (Signed)
Patient ID: Daisy Flores, female   DOB: [birth year], 28 y.o.   MRN: [id number]  Chief Complaint  Patient presents with  . Follow-up    u/s results    HPI Daisy Flores is a 28 y.o. female.  History of pelvic pain.  Ultrasound done.  Presents for results.  Having less pain now. HPI  History reviewed. No pertinent past medical history.  History reviewed. No pertinent surgical history.  Family History  Problem Relation Age of Onset  . Hypertension Mother   . Hypertension Father     Social History Social History   Tobacco Use  . Smoking status: Never Smoker  . Smokeless tobacco: Never Used  Substance Use Topics  . Alcohol use: Yes    Alcohol/week: 0.0 standard drinks    Comment: Occ.   . Drug use: No    Allergies  Allergen Reactions  . Diflucan [Fluconazole] Itching  . Penicillins Hives and Rash    PT STATES ALL TYPES OF CILLINS CAUSE RASHES AS WELL AS HIVES    Current Outpatient Medications  Medication Sig Dispense Refill  . clotrimazole (GYNE-LOTRIMIN 3) 2 % vaginal cream Place 1 Applicatorful vaginally at bedtime. 21 g 0  . cyclobenzaprine (FLEXERIL) 5 MG tablet Take 5 mg by mouth 3 (three) times daily as needed.  0  . LO LOESTRIN FE 1 MG-10 MCG / 10 MCG tablet TAKE 1 TABLET BY MOUTH DAILY 28 tablet 11  . LORazepam (ATIVAN) 1 MG tablet Take 1 tablet (1 mg total) by mouth every 8 (eight) hours as needed for anxiety. (Patient not taking: Reported on 06/24/2018) 10 tablet 0  . terconazole (TERAZOL 7) 0.4 % vaginal cream Place 1 applicator vaginally at bedtime. 45 g 0   No current facility-administered medications for this visit.     Review of Systems Review of Systems Constitutional: negative for fatigue and weight loss Respiratory: negative for cough and wheezing Cardiovascular: negative for chest pain, fatigue and palpitations Gastrointestinal: negative for abdominal pain and change in bowel habits Genitourinary:positive for pelvic pain Integument/breast:  negative for nipple discharge Musculoskeletal:negative for myalgias Neurological: negative for gait problems and tremors Behavioral/Psych: negative for abusive relationship, depression Endocrine: negative for temperature intolerance      Blood pressure 117/74, pulse 86, weight 154 lb (69.9 kg).  Physical Exam Physical Exam:  Deferred  >50% of 15 min visit spent on counseling and coordination of care.   Data Reviewed Ultrasound: US PELVIC COMPLETE WITH TRANSVAGINAL (Accession [id number]) (Order [id number])  Imaging  Date: 07/01/2018 [facility] Released By: Antony HasteWalden, Kathy W Authorizing: Brock BadHarper, Charles A, MD  Exam Information   Status Exam Begun  Exam Ended   Final [99] 07/01/2018 1:14 PM 07/01/2018 2:13 PM  PACS Images   Show images for US PELVIC COMPLETE WITH TRANSVAGINAL  Study Result   CLINICAL DATA:  Pelvic pain for 3 months, on birth control pills since October, only spotting  EXAM: TRANSABDOMINAL AND TRANSVAGINAL ULTRASOUND OF PELVIS  TECHNIQUE: Both transabdominal and transvaginal ultrasound examinations of the pelvis were performed. Transabdominal technique was performed for global imaging of the pelvis including uterus, ovaries, adnexal regions, and pelvic cul-de-sac. It was necessary to proceed with endovaginal exam following the transabdominal exam to visualize the ovaries and endometrium.  COMPARISON:  None  FINDINGS: Uterus  Measurements: 6.9 x 3.4 x 4.4 cm = volume: 54 mL. Inhomogeneous myometrial echogenicity without discrete mass. A questionable 8 x 7 mm intramural nodule at the posterior mid uterus on longitudinal  view is not adequately delineated on transverse imaging to confirm a tiny leiomyoma.  Endometrium  Thickness: 4 mm, normal.  No endometrial fluid or focal abnormality  Right ovary  Measurements: 2.4 x 1.3 x 1.3 cm = volume: 2.0 mL. Normal morphology without mass  Left  ovary  Measurements: 2.9 x 1.5 x 2.3 cm = volume: 5.1 mL. Normal morphology without mass  Other findings  Trace free pelvic fluid.  No adnexal masses.  IMPRESSION: No definite pelvic sonographic abnormalities identified.   Electronically Signed   By: Ulyses Southward M.D.   On: 07/01/2018 14:31    Assessment     1. Pelvic pain - improved    Plan    Follow up in 6 weeks  No orders of the defined types were placed in this encounter.  No orders of the defined types were placed in this encounter.   Brock Bad MD 07-08-2018

## 2018-08-13 ENCOUNTER — Other Ambulatory Visit: Payer: Self-pay | Admitting: Obstetrics

## 2023-07-16 ENCOUNTER — Telehealth: Payer: Self-pay | Admitting: Licensed Clinical Social Worker

## 2023-07-16 NOTE — Telephone Encounter (Signed)
[facility] contacted patient on this date to provide [facility] intro and schedule [facility] appointment. [facility] left a VM.
